# Patient Record
Sex: Female | Born: 1945 | Race: White | Hispanic: No | Marital: Married | Smoking: Current some day smoker
Health system: Southern US, Community
[De-identification: ages and names within clinical notes are randomized; demographics above are authoritative.]

## PROBLEM LIST (undated history)

## (undated) DIAGNOSIS — Z9221 Personal history of antineoplastic chemotherapy: Secondary | ICD-10-CM

## (undated) DIAGNOSIS — C50919 Malignant neoplasm of unspecified site of unspecified female breast: Secondary | ICD-10-CM

## (undated) DIAGNOSIS — C801 Malignant (primary) neoplasm, unspecified: Secondary | ICD-10-CM

## (undated) HISTORY — PX: HYSTEROTOMY: SHX1776

## (undated) HISTORY — PX: MASTECTOMY: SHX3

---

## 1999-08-17 ENCOUNTER — Encounter: Payer: Self-pay | Admitting: Endocrinology

## 1999-08-17 ENCOUNTER — Encounter: Admission: RE | Admit: 1999-08-17 | Discharge: 1999-08-17 | Payer: Self-pay | Admitting: Endocrinology

## 1999-08-18 ENCOUNTER — Encounter: Admission: RE | Admit: 1999-08-18 | Discharge: 1999-08-18 | Payer: Self-pay | Admitting: Endocrinology

## 1999-08-18 ENCOUNTER — Encounter: Payer: Self-pay | Admitting: Endocrinology

## 2000-08-22 ENCOUNTER — Encounter: Admission: RE | Admit: 2000-08-22 | Discharge: 2000-08-22 | Payer: Self-pay | Admitting: Endocrinology

## 2000-08-22 ENCOUNTER — Encounter: Payer: Self-pay | Admitting: Endocrinology

## 2001-08-27 ENCOUNTER — Encounter: Admission: RE | Admit: 2001-08-27 | Discharge: 2001-08-27 | Payer: Self-pay | Admitting: Endocrinology

## 2001-08-27 ENCOUNTER — Encounter: Payer: Self-pay | Admitting: Endocrinology

## 2001-12-15 ENCOUNTER — Emergency Department (HOSPITAL_COMMUNITY): Admission: EM | Admit: 2001-12-15 | Discharge: 2001-12-15 | Payer: Self-pay | Admitting: Emergency Medicine

## 2002-03-15 ENCOUNTER — Encounter (INDEPENDENT_AMBULATORY_CARE_PROVIDER_SITE_OTHER): Payer: Self-pay | Admitting: Specialist

## 2002-03-15 ENCOUNTER — Ambulatory Visit (HOSPITAL_COMMUNITY): Admission: RE | Admit: 2002-03-15 | Discharge: 2002-03-15 | Payer: Self-pay | Admitting: Gastroenterology

## 2002-08-30 ENCOUNTER — Encounter: Payer: Self-pay | Admitting: Endocrinology

## 2002-08-30 ENCOUNTER — Encounter: Admission: RE | Admit: 2002-08-30 | Discharge: 2002-08-30 | Payer: Self-pay | Admitting: Endocrinology

## 2003-09-01 ENCOUNTER — Encounter: Admission: RE | Admit: 2003-09-01 | Discharge: 2003-09-01 | Payer: Self-pay | Admitting: Endocrinology

## 2004-09-01 ENCOUNTER — Encounter: Admission: RE | Admit: 2004-09-01 | Discharge: 2004-09-01 | Payer: Self-pay | Admitting: Endocrinology

## 2005-09-05 ENCOUNTER — Encounter: Admission: RE | Admit: 2005-09-05 | Discharge: 2005-09-05 | Payer: Self-pay | Admitting: Endocrinology

## 2006-09-07 ENCOUNTER — Encounter: Admission: RE | Admit: 2006-09-07 | Discharge: 2006-09-07 | Payer: Self-pay | Admitting: Endocrinology

## 2007-09-10 ENCOUNTER — Encounter: Admission: RE | Admit: 2007-09-10 | Discharge: 2007-09-10 | Payer: Self-pay | Admitting: Endocrinology

## 2008-09-12 ENCOUNTER — Encounter: Admission: RE | Admit: 2008-09-12 | Discharge: 2008-09-12 | Payer: Self-pay | Admitting: Endocrinology

## 2009-04-23 ENCOUNTER — Emergency Department (HOSPITAL_COMMUNITY): Admission: EM | Admit: 2009-04-23 | Discharge: 2009-04-23 | Payer: Self-pay | Admitting: Family Medicine

## 2009-09-16 ENCOUNTER — Encounter: Admission: RE | Admit: 2009-09-16 | Discharge: 2009-09-16 | Payer: Self-pay | Admitting: Endocrinology

## 2009-12-09 ENCOUNTER — Encounter: Payer: Self-pay | Admitting: Emergency Medicine

## 2009-12-10 ENCOUNTER — Inpatient Hospital Stay (HOSPITAL_COMMUNITY): Admission: EM | Admit: 2009-12-10 | Discharge: 2009-12-10 | Payer: Self-pay | Admitting: Cardiology

## 2010-07-09 LAB — CBC
HCT: 43.8 % (ref 36.0–46.0)
Hemoglobin: 15 g/dL (ref 12.0–15.0)
MCH: 31.1 pg (ref 26.0–34.0)
MCHC: 34.3 g/dL (ref 30.0–36.0)
MCHC: 33.9 g/dL (ref 30.0–36.0)
MCV: 93.8 fL (ref 78.0–100.0)
Platelets: 254 10*3/uL (ref 150–400)
RBC: 4.67 MIL/uL (ref 3.87–5.11)
RBC: 4.37 MIL/uL (ref 3.87–5.11)
WBC: 10.1 10*3/uL (ref 4.0–10.5)
WBC: 8.9 10*3/uL (ref 4.0–10.5)

## 2010-07-09 LAB — COMPREHENSIVE METABOLIC PANEL
AST: 23 U/L (ref 0–37)
Alkaline Phosphatase: 54 U/L (ref 39–117)
BUN: 15 mg/dL (ref 6–23)
Chloride: 109 mEq/L (ref 96–112)
Potassium: 3.7 mEq/L (ref 3.5–5.1)
Sodium: 141 mEq/L (ref 135–145)
Total Protein: 6.7 g/dL (ref 6.0–8.3)

## 2010-07-09 LAB — PROTIME-INR: INR: 0.95 (ref 0.00–1.49)

## 2010-07-09 LAB — BASIC METABOLIC PANEL
BUN: 20 mg/dL (ref 6–23)
CO2: 25 mEq/L (ref 19–32)
Creatinine, Ser: 0.72 mg/dL (ref 0.4–1.2)
GFR calc Af Amer: >60 mL/min (ref >60)
GFR calc non Af Amer: >60 mL/min (ref >60)
Sodium: 142 mEq/L (ref 135–145)

## 2010-07-09 LAB — CARDIAC PANEL(CRET KIN+CKTOT+MB+TROPI)
CK, MB: 2 ng/mL (ref 0.3–4.0)
CK, MB: 2 ng/mL (ref 0.3–4.0)
Relative Index: INVALID (ref 0.0–2.5)
Relative Index: 1.9 (ref 0.0–2.5)
Relative Index: 2.5 (ref 0.0–2.5)
Total CK: 93 U/L (ref 7–177)
Total CK: 103 U/L (ref 7–177)
Total CK: 104 U/L (ref 7–177)
Troponin I: 0.01 ng/mL (ref 0.00–0.06)
Troponin I: 0.01 ng/mL (ref 0.00–0.06)

## 2010-07-09 LAB — POCT CARDIAC MARKERS
CKMB, poc: 1.3 ng/mL (ref 1.0–8.0)
CKMB, poc: <1 ng/mL — ABNORMAL LOW (ref 1.0–8.0)

## 2010-07-09 LAB — DIFFERENTIAL
Basophils Relative: 1 % (ref 0–1)
Monocytes Absolute: 0.5 10*3/uL (ref 0.1–1.0)
Neutro Abs: 5.5 10*3/uL (ref 1.7–7.7)

## 2010-07-09 LAB — HEMOGLOBIN A1C
Hgb A1c MFr Bld: 5.9 % — ABNORMAL HIGH (ref <5.7)
Mean Plasma Glucose: 123 mg/dL — ABNORMAL HIGH (ref <117)

## 2010-07-09 LAB — TSH: TSH: 2.213 u[IU]/mL (ref 0.350–4.500)

## 2010-07-09 LAB — HEPARIN LEVEL (UNFRACTIONATED): Heparin Unfractionated: 0.16 IU/mL — ABNORMAL LOW (ref 0.30–0.70)

## 2010-07-09 LAB — LIPID PANEL: Cholesterol: 157 mg/dL (ref 0–200)

## 2010-08-06 ENCOUNTER — Other Ambulatory Visit: Payer: Self-pay | Admitting: Internal Medicine

## 2010-08-06 DIAGNOSIS — Z9010 Acquired absence of unspecified breast and nipple: Secondary | ICD-10-CM

## 2010-10-05 ENCOUNTER — Ambulatory Visit
Admission: RE | Admit: 2010-10-05 | Discharge: 2010-10-05 | Disposition: A | Discharge status: Home / Self Care | Payer: Federal, State, Local not specified - PPO | Source: Ambulatory Visit | Attending: Internal Medicine | Admitting: Internal Medicine

## 2010-10-05 DIAGNOSIS — Z9010 Acquired absence of unspecified breast and nipple: Secondary | ICD-10-CM

## 2011-08-18 DIAGNOSIS — Z1331 Encounter for screening for depression: Secondary | ICD-10-CM | POA: Diagnosis not present

## 2011-08-18 DIAGNOSIS — F17200 Nicotine dependence, unspecified, uncomplicated: Secondary | ICD-10-CM | POA: Diagnosis not present

## 2011-08-18 DIAGNOSIS — M899 Disorder of bone, unspecified: Secondary | ICD-10-CM | POA: Diagnosis not present

## 2011-08-18 DIAGNOSIS — C50919 Malignant neoplasm of unspecified site of unspecified female breast: Secondary | ICD-10-CM | POA: Diagnosis not present

## 2011-08-18 DIAGNOSIS — I10 Essential (primary) hypertension: Secondary | ICD-10-CM | POA: Diagnosis not present

## 2011-08-18 DIAGNOSIS — M949 Disorder of cartilage, unspecified: Secondary | ICD-10-CM | POA: Diagnosis not present

## 2011-08-18 DIAGNOSIS — E782 Mixed hyperlipidemia: Secondary | ICD-10-CM | POA: Diagnosis not present

## 2011-08-18 DIAGNOSIS — R7309 Other abnormal glucose: Secondary | ICD-10-CM | POA: Diagnosis not present

## 2011-09-05 ENCOUNTER — Other Ambulatory Visit: Payer: Self-pay | Admitting: Internal Medicine

## 2011-09-05 DIAGNOSIS — M949 Disorder of cartilage, unspecified: Secondary | ICD-10-CM | POA: Diagnosis not present

## 2011-09-05 DIAGNOSIS — Z1231 Encounter for screening mammogram for malignant neoplasm of breast: Secondary | ICD-10-CM

## 2011-09-05 DIAGNOSIS — Z9012 Acquired absence of left breast and nipple: Secondary | ICD-10-CM

## 2011-09-05 DIAGNOSIS — M899 Disorder of bone, unspecified: Secondary | ICD-10-CM | POA: Diagnosis not present

## 2011-09-20 DIAGNOSIS — Z79899 Other long term (current) drug therapy: Secondary | ICD-10-CM | POA: Diagnosis not present

## 2011-10-11 ENCOUNTER — Ambulatory Visit
Admission: RE | Admit: 2011-10-11 | Discharge: 2011-10-11 | Disposition: A | Discharge status: Home / Self Care | Payer: Federal, State, Local not specified - PPO | Source: Ambulatory Visit | Attending: Internal Medicine | Admitting: Internal Medicine

## 2011-10-11 DIAGNOSIS — Z9012 Acquired absence of left breast and nipple: Secondary | ICD-10-CM

## 2011-10-11 DIAGNOSIS — Z1231 Encounter for screening mammogram for malignant neoplasm of breast: Secondary | ICD-10-CM

## 2012-02-10 DIAGNOSIS — F17200 Nicotine dependence, unspecified, uncomplicated: Secondary | ICD-10-CM | POA: Diagnosis not present

## 2012-02-10 DIAGNOSIS — Z23 Encounter for immunization: Secondary | ICD-10-CM | POA: Diagnosis not present

## 2012-02-10 DIAGNOSIS — I10 Essential (primary) hypertension: Secondary | ICD-10-CM | POA: Diagnosis not present

## 2012-02-10 DIAGNOSIS — C50919 Malignant neoplasm of unspecified site of unspecified female breast: Secondary | ICD-10-CM | POA: Diagnosis not present

## 2012-02-10 DIAGNOSIS — E782 Mixed hyperlipidemia: Secondary | ICD-10-CM | POA: Diagnosis not present

## 2012-02-10 DIAGNOSIS — M949 Disorder of cartilage, unspecified: Secondary | ICD-10-CM | POA: Diagnosis not present

## 2012-02-10 DIAGNOSIS — M899 Disorder of bone, unspecified: Secondary | ICD-10-CM | POA: Diagnosis not present

## 2012-03-23 DIAGNOSIS — M2550 Pain in unspecified joint: Secondary | ICD-10-CM | POA: Diagnosis not present

## 2012-03-23 DIAGNOSIS — R609 Edema, unspecified: Secondary | ICD-10-CM | POA: Diagnosis not present

## 2012-03-23 DIAGNOSIS — M79609 Pain in unspecified limb: Secondary | ICD-10-CM | POA: Diagnosis not present

## 2012-04-13 DIAGNOSIS — H40019 Open angle with borderline findings, low risk, unspecified eye: Secondary | ICD-10-CM | POA: Diagnosis not present

## 2012-08-15 DIAGNOSIS — C50919 Malignant neoplasm of unspecified site of unspecified female breast: Secondary | ICD-10-CM | POA: Diagnosis not present

## 2012-08-15 DIAGNOSIS — E782 Mixed hyperlipidemia: Secondary | ICD-10-CM | POA: Diagnosis not present

## 2012-08-15 DIAGNOSIS — R7309 Other abnormal glucose: Secondary | ICD-10-CM | POA: Diagnosis not present

## 2012-08-15 DIAGNOSIS — F17200 Nicotine dependence, unspecified, uncomplicated: Secondary | ICD-10-CM | POA: Diagnosis not present

## 2012-08-15 DIAGNOSIS — Z1331 Encounter for screening for depression: Secondary | ICD-10-CM | POA: Diagnosis not present

## 2012-08-15 DIAGNOSIS — Z0000 Encounter for general adult medical examination without abnormal findings: Secondary | ICD-10-CM | POA: Diagnosis not present

## 2012-08-15 DIAGNOSIS — M899 Disorder of bone, unspecified: Secondary | ICD-10-CM | POA: Diagnosis not present

## 2012-08-15 DIAGNOSIS — I10 Essential (primary) hypertension: Secondary | ICD-10-CM | POA: Diagnosis not present

## 2012-09-12 DIAGNOSIS — D72829 Elevated white blood cell count, unspecified: Secondary | ICD-10-CM | POA: Diagnosis not present

## 2012-09-20 ENCOUNTER — Other Ambulatory Visit: Payer: Self-pay

## 2012-09-20 DIAGNOSIS — Z9012 Acquired absence of left breast and nipple: Secondary | ICD-10-CM

## 2012-09-20 DIAGNOSIS — Z1231 Encounter for screening mammogram for malignant neoplasm of breast: Secondary | ICD-10-CM

## 2012-11-06 ENCOUNTER — Ambulatory Visit: Payer: Medicare Other

## 2012-11-07 ENCOUNTER — Ambulatory Visit: Payer: Medicare Other

## 2012-11-22 DIAGNOSIS — T148 Other injury of unspecified body region: Secondary | ICD-10-CM | POA: Diagnosis not present

## 2012-11-28 ENCOUNTER — Ambulatory Visit
Admission: RE | Admit: 2012-11-28 | Discharge: 2012-11-28 | Disposition: A | Discharge status: Home / Self Care | Payer: Medicare Other | Source: Ambulatory Visit

## 2012-11-28 DIAGNOSIS — Z1231 Encounter for screening mammogram for malignant neoplasm of breast: Secondary | ICD-10-CM

## 2012-11-28 DIAGNOSIS — Z9012 Acquired absence of left breast and nipple: Secondary | ICD-10-CM

## 2012-11-29 ENCOUNTER — Other Ambulatory Visit: Payer: Self-pay | Admitting: Internal Medicine

## 2012-11-29 DIAGNOSIS — R928 Other abnormal and inconclusive findings on diagnostic imaging of breast: Secondary | ICD-10-CM

## 2012-12-13 ENCOUNTER — Ambulatory Visit
Admission: RE | Admit: 2012-12-13 | Discharge: 2012-12-13 | Disposition: A | Discharge status: Home / Self Care | Payer: Medicare Other | Source: Ambulatory Visit | Attending: Internal Medicine | Admitting: Internal Medicine

## 2012-12-13 DIAGNOSIS — R928 Other abnormal and inconclusive findings on diagnostic imaging of breast: Secondary | ICD-10-CM | POA: Diagnosis not present

## 2012-12-13 DIAGNOSIS — Z853 Personal history of malignant neoplasm of breast: Secondary | ICD-10-CM | POA: Diagnosis not present

## 2013-01-28 ENCOUNTER — Ambulatory Visit
Admission: RE | Admit: 2013-01-28 | Discharge: 2013-01-28 | Disposition: A | Discharge status: Home / Self Care | Payer: Medicare Other | Source: Ambulatory Visit | Attending: Internal Medicine | Admitting: Internal Medicine

## 2013-01-28 ENCOUNTER — Other Ambulatory Visit: Payer: Self-pay | Admitting: Internal Medicine

## 2013-01-28 DIAGNOSIS — R519 Headache, unspecified: Secondary | ICD-10-CM

## 2013-01-28 DIAGNOSIS — R000 Tachycardia, unspecified: Secondary | ICD-10-CM | POA: Diagnosis not present

## 2013-01-28 DIAGNOSIS — J3489 Other specified disorders of nose and nasal sinuses: Secondary | ICD-10-CM | POA: Diagnosis not present

## 2013-01-28 DIAGNOSIS — R6889 Other general symptoms and signs: Secondary | ICD-10-CM | POA: Diagnosis not present

## 2013-01-28 DIAGNOSIS — R51 Headache: Secondary | ICD-10-CM | POA: Diagnosis not present

## 2013-02-13 DIAGNOSIS — E782 Mixed hyperlipidemia: Secondary | ICD-10-CM | POA: Diagnosis not present

## 2013-02-13 DIAGNOSIS — R7309 Other abnormal glucose: Secondary | ICD-10-CM | POA: Diagnosis not present

## 2013-02-13 DIAGNOSIS — R700 Elevated erythrocyte sedimentation rate: Secondary | ICD-10-CM | POA: Diagnosis not present

## 2013-02-13 DIAGNOSIS — F17200 Nicotine dependence, unspecified, uncomplicated: Secondary | ICD-10-CM | POA: Diagnosis not present

## 2013-02-13 DIAGNOSIS — Z23 Encounter for immunization: Secondary | ICD-10-CM | POA: Diagnosis not present

## 2013-02-13 DIAGNOSIS — I10 Essential (primary) hypertension: Secondary | ICD-10-CM | POA: Diagnosis not present

## 2013-02-25 DIAGNOSIS — T148 Other injury of unspecified body region: Secondary | ICD-10-CM | POA: Diagnosis not present

## 2013-02-25 DIAGNOSIS — R51 Headache: Secondary | ICD-10-CM | POA: Diagnosis not present

## 2013-02-25 DIAGNOSIS — R634 Abnormal weight loss: Secondary | ICD-10-CM | POA: Diagnosis not present

## 2013-02-25 DIAGNOSIS — M2550 Pain in unspecified joint: Secondary | ICD-10-CM | POA: Diagnosis not present

## 2013-02-25 DIAGNOSIS — R700 Elevated erythrocyte sedimentation rate: Secondary | ICD-10-CM | POA: Diagnosis not present

## 2013-03-18 DIAGNOSIS — R700 Elevated erythrocyte sedimentation rate: Secondary | ICD-10-CM | POA: Diagnosis not present

## 2013-03-18 DIAGNOSIS — M2550 Pain in unspecified joint: Secondary | ICD-10-CM | POA: Diagnosis not present

## 2013-03-18 DIAGNOSIS — R51 Headache: Secondary | ICD-10-CM | POA: Diagnosis not present

## 2013-08-14 DIAGNOSIS — E782 Mixed hyperlipidemia: Secondary | ICD-10-CM | POA: Diagnosis not present

## 2013-08-14 DIAGNOSIS — Z1331 Encounter for screening for depression: Secondary | ICD-10-CM | POA: Diagnosis not present

## 2013-08-14 DIAGNOSIS — Z0000 Encounter for general adult medical examination without abnormal findings: Secondary | ICD-10-CM | POA: Diagnosis not present

## 2013-08-14 DIAGNOSIS — Z23 Encounter for immunization: Secondary | ICD-10-CM | POA: Diagnosis not present

## 2013-08-14 DIAGNOSIS — F17200 Nicotine dependence, unspecified, uncomplicated: Secondary | ICD-10-CM | POA: Diagnosis not present

## 2013-08-14 DIAGNOSIS — R7309 Other abnormal glucose: Secondary | ICD-10-CM | POA: Diagnosis not present

## 2013-08-14 DIAGNOSIS — C50919 Malignant neoplasm of unspecified site of unspecified female breast: Secondary | ICD-10-CM | POA: Diagnosis not present

## 2013-08-14 DIAGNOSIS — I10 Essential (primary) hypertension: Secondary | ICD-10-CM | POA: Diagnosis not present

## 2013-12-09 ENCOUNTER — Other Ambulatory Visit: Payer: Self-pay

## 2013-12-09 DIAGNOSIS — Z1231 Encounter for screening mammogram for malignant neoplasm of breast: Secondary | ICD-10-CM

## 2013-12-09 DIAGNOSIS — Z9012 Acquired absence of left breast and nipple: Secondary | ICD-10-CM

## 2013-12-17 ENCOUNTER — Ambulatory Visit
Admission: RE | Admit: 2013-12-17 | Discharge: 2013-12-17 | Disposition: A | Discharge status: Home / Self Care | Payer: Medicare Other | Source: Ambulatory Visit

## 2013-12-17 DIAGNOSIS — Z1231 Encounter for screening mammogram for malignant neoplasm of breast: Secondary | ICD-10-CM | POA: Diagnosis not present

## 2013-12-17 DIAGNOSIS — Z9012 Acquired absence of left breast and nipple: Secondary | ICD-10-CM

## 2013-12-19 ENCOUNTER — Other Ambulatory Visit: Payer: Self-pay | Admitting: Internal Medicine

## 2013-12-19 DIAGNOSIS — R928 Other abnormal and inconclusive findings on diagnostic imaging of breast: Secondary | ICD-10-CM

## 2013-12-26 ENCOUNTER — Ambulatory Visit
Admission: RE | Admit: 2013-12-26 | Discharge: 2013-12-26 | Disposition: A | Discharge status: Home / Self Care | Payer: Medicare Other | Source: Ambulatory Visit | Attending: Internal Medicine | Admitting: Internal Medicine

## 2013-12-26 ENCOUNTER — Encounter (INDEPENDENT_AMBULATORY_CARE_PROVIDER_SITE_OTHER): Payer: Self-pay

## 2013-12-26 DIAGNOSIS — R928 Other abnormal and inconclusive findings on diagnostic imaging of breast: Secondary | ICD-10-CM

## 2013-12-26 DIAGNOSIS — R922 Inconclusive mammogram: Secondary | ICD-10-CM | POA: Diagnosis not present

## 2013-12-26 DIAGNOSIS — Z853 Personal history of malignant neoplasm of breast: Secondary | ICD-10-CM | POA: Diagnosis not present

## 2014-02-11 DIAGNOSIS — E780 Pure hypercholesterolemia: Secondary | ICD-10-CM | POA: Diagnosis not present

## 2014-02-11 DIAGNOSIS — Z23 Encounter for immunization: Secondary | ICD-10-CM | POA: Diagnosis not present

## 2014-02-11 DIAGNOSIS — M8580 Other specified disorders of bone density and structure, unspecified site: Secondary | ICD-10-CM | POA: Diagnosis not present

## 2014-02-11 DIAGNOSIS — C50912 Malignant neoplasm of unspecified site of left female breast: Secondary | ICD-10-CM | POA: Diagnosis not present

## 2014-02-11 DIAGNOSIS — R7309 Other abnormal glucose: Secondary | ICD-10-CM | POA: Diagnosis not present

## 2014-02-11 DIAGNOSIS — F17210 Nicotine dependence, cigarettes, uncomplicated: Secondary | ICD-10-CM | POA: Diagnosis not present

## 2014-02-11 DIAGNOSIS — I10 Essential (primary) hypertension: Secondary | ICD-10-CM | POA: Diagnosis not present

## 2014-04-07 DIAGNOSIS — H2513 Age-related nuclear cataract, bilateral: Secondary | ICD-10-CM | POA: Diagnosis not present

## 2014-04-07 DIAGNOSIS — H43813 Vitreous degeneration, bilateral: Secondary | ICD-10-CM | POA: Diagnosis not present

## 2014-04-07 DIAGNOSIS — H1859 Other hereditary corneal dystrophies: Secondary | ICD-10-CM | POA: Diagnosis not present

## 2014-04-07 DIAGNOSIS — H5213 Myopia, bilateral: Secondary | ICD-10-CM | POA: Diagnosis not present

## 2014-05-16 IMAGING — MG MM DIGITAL SCREENING UNILAT*R* W/ CAD
2 series · 2 of 2 positions shown · non-contrast
Comparison: None

CLINICAL DATA: Screening.

RIGHT DIGITAL SCREENING MAMMOGRAM WITH CAD

[R MLO]
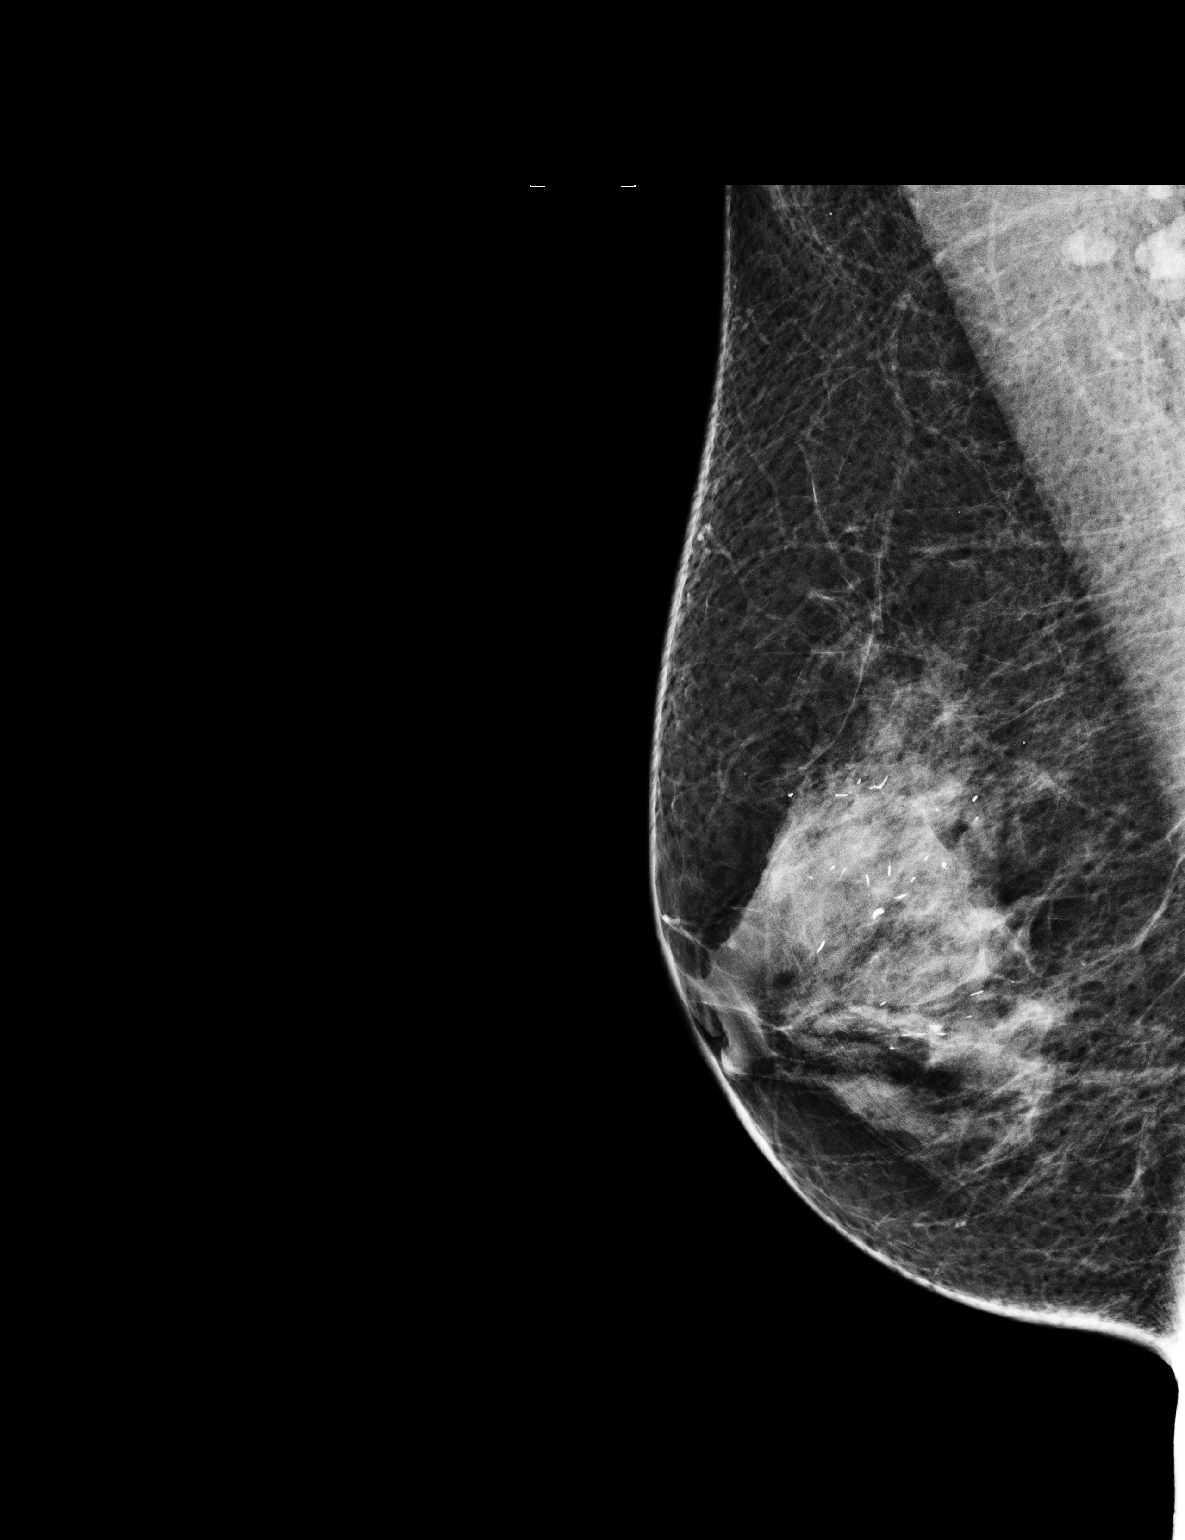

[R CC]
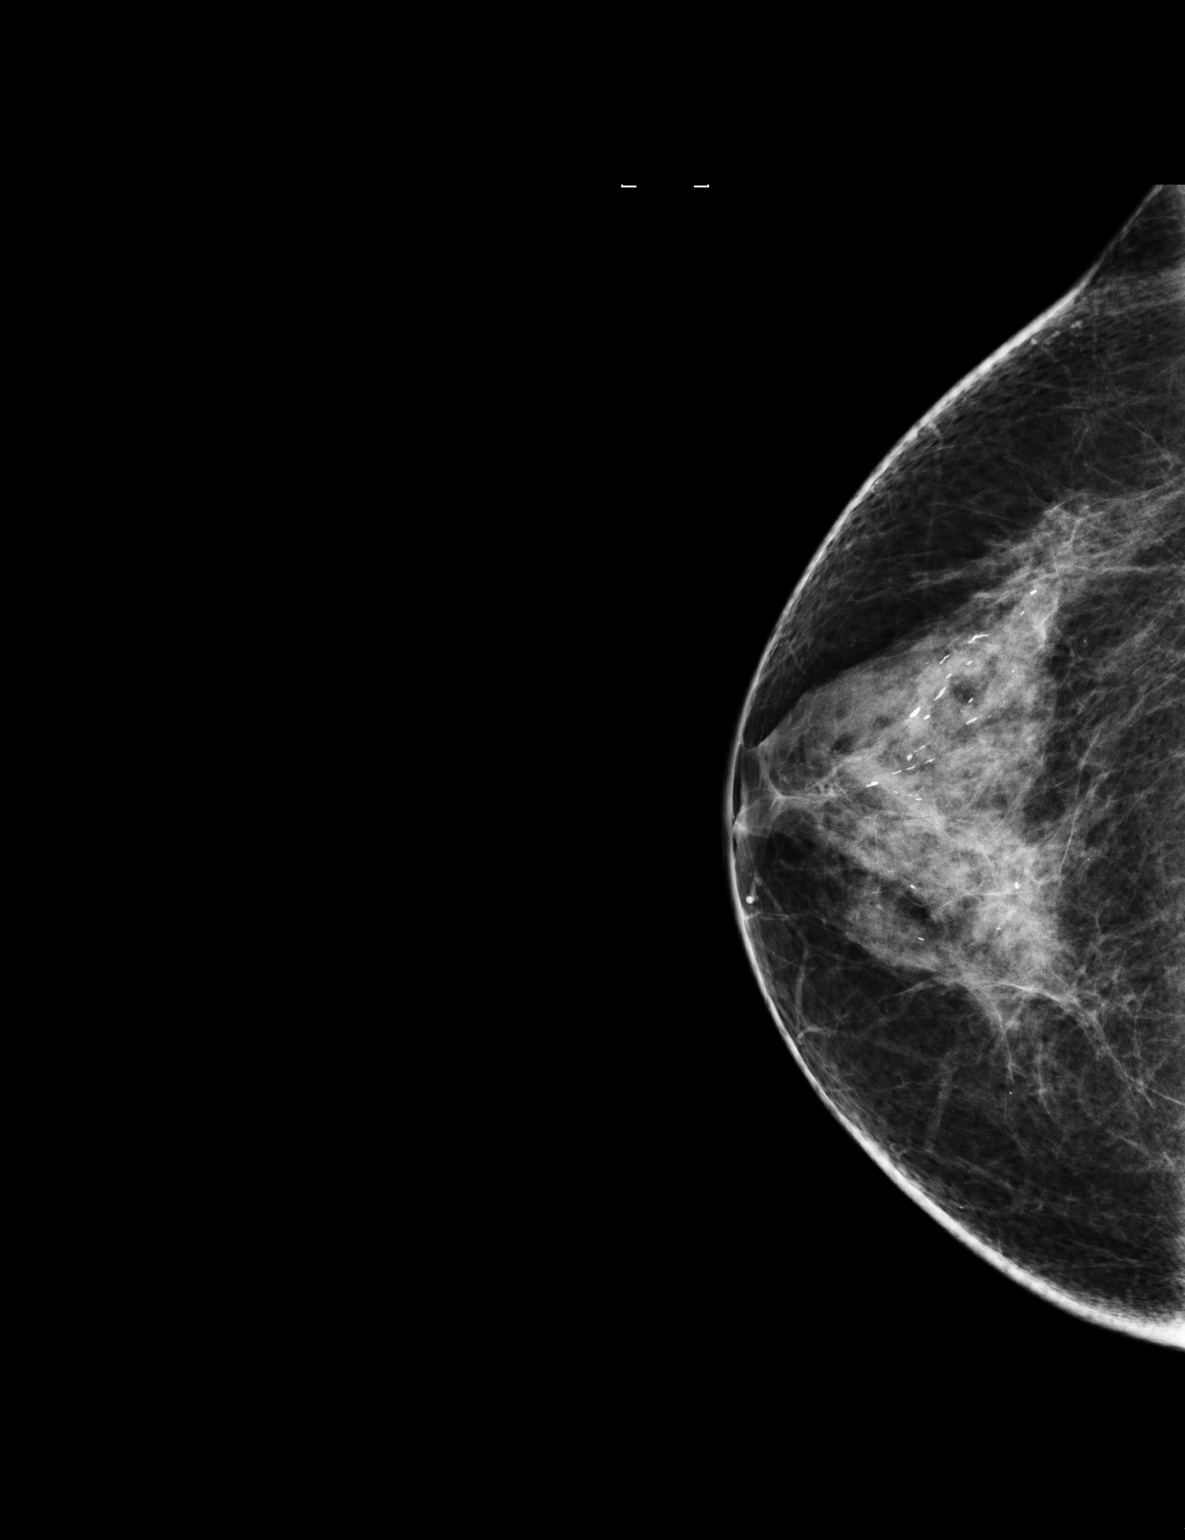

[2 of 2 positions shown; findings below may reference images not displayed]

FINDINGS: The patient has had a left mastectomy.  Views of the
right breast demonstrate heterogeneously dense tissue.  No
suspicious masses, architectural distortion, or calcifications are
present.

Images were processed with CAD.
IMPRESSION: No evidence of malignancy.  Screening mammography is recommended in
one year.

BI-RADS CATEGORY 1:  Negative

## 2014-08-19 DIAGNOSIS — E782 Mixed hyperlipidemia: Secondary | ICD-10-CM | POA: Diagnosis not present

## 2014-08-19 DIAGNOSIS — F17210 Nicotine dependence, cigarettes, uncomplicated: Secondary | ICD-10-CM | POA: Diagnosis not present

## 2014-08-19 DIAGNOSIS — M8588 Other specified disorders of bone density and structure, other site: Secondary | ICD-10-CM | POA: Diagnosis not present

## 2014-08-19 DIAGNOSIS — I10 Essential (primary) hypertension: Secondary | ICD-10-CM | POA: Diagnosis not present

## 2014-08-19 DIAGNOSIS — Z0000 Encounter for general adult medical examination without abnormal findings: Secondary | ICD-10-CM | POA: Diagnosis not present

## 2014-08-19 DIAGNOSIS — C50912 Malignant neoplasm of unspecified site of left female breast: Secondary | ICD-10-CM | POA: Diagnosis not present

## 2014-08-19 DIAGNOSIS — Z1389 Encounter for screening for other disorder: Secondary | ICD-10-CM | POA: Diagnosis not present

## 2014-08-19 DIAGNOSIS — R7309 Other abnormal glucose: Secondary | ICD-10-CM | POA: Diagnosis not present

## 2014-09-17 DIAGNOSIS — M810 Age-related osteoporosis without current pathological fracture: Secondary | ICD-10-CM | POA: Diagnosis not present

## 2015-02-06 ENCOUNTER — Other Ambulatory Visit: Payer: Self-pay

## 2015-02-06 DIAGNOSIS — Z1231 Encounter for screening mammogram for malignant neoplasm of breast: Secondary | ICD-10-CM

## 2015-02-13 ENCOUNTER — Ambulatory Visit: Payer: Federal, State, Local not specified - PPO

## 2015-02-19 DIAGNOSIS — R7309 Other abnormal glucose: Secondary | ICD-10-CM | POA: Diagnosis not present

## 2015-02-19 DIAGNOSIS — E782 Mixed hyperlipidemia: Secondary | ICD-10-CM | POA: Diagnosis not present

## 2015-02-19 DIAGNOSIS — C50919 Malignant neoplasm of unspecified site of unspecified female breast: Secondary | ICD-10-CM | POA: Diagnosis not present

## 2015-02-19 DIAGNOSIS — I10 Essential (primary) hypertension: Secondary | ICD-10-CM | POA: Diagnosis not present

## 2015-02-19 DIAGNOSIS — Z23 Encounter for immunization: Secondary | ICD-10-CM | POA: Diagnosis not present

## 2015-02-19 DIAGNOSIS — F17210 Nicotine dependence, cigarettes, uncomplicated: Secondary | ICD-10-CM | POA: Diagnosis not present

## 2015-02-25 ENCOUNTER — Ambulatory Visit
Admission: RE | Admit: 2015-02-25 | Discharge: 2015-02-25 | Disposition: A | Discharge status: Home / Self Care | Payer: Medicare Other | Source: Ambulatory Visit

## 2015-02-25 DIAGNOSIS — Z1231 Encounter for screening mammogram for malignant neoplasm of breast: Secondary | ICD-10-CM

## 2015-03-02 ENCOUNTER — Emergency Department (HOSPITAL_COMMUNITY)
Admission: EM | Admit: 2015-03-02 | Discharge: 2015-03-02 | Disposition: A | Discharge status: Home / Self Care | Payer: Medicare Other | Attending: Emergency Medicine | Admitting: Emergency Medicine

## 2015-03-02 ENCOUNTER — Emergency Department (HOSPITAL_COMMUNITY): Payer: Medicare Other

## 2015-03-02 ENCOUNTER — Encounter (HOSPITAL_COMMUNITY): Payer: Self-pay | Admitting: Nurse Practitioner

## 2015-03-02 DIAGNOSIS — Y9389 Activity, other specified: Secondary | ICD-10-CM | POA: Insufficient documentation

## 2015-03-02 DIAGNOSIS — Z859 Personal history of malignant neoplasm, unspecified: Secondary | ICD-10-CM | POA: Diagnosis not present

## 2015-03-02 DIAGNOSIS — F419 Anxiety disorder, unspecified: Secondary | ICD-10-CM | POA: Diagnosis not present

## 2015-03-02 DIAGNOSIS — M25511 Pain in right shoulder: Secondary | ICD-10-CM | POA: Diagnosis not present

## 2015-03-02 DIAGNOSIS — Y998 Other external cause status: Secondary | ICD-10-CM | POA: Diagnosis not present

## 2015-03-02 DIAGNOSIS — S42211A Unspecified displaced fracture of surgical neck of right humerus, initial encounter for closed fracture: Secondary | ICD-10-CM | POA: Insufficient documentation

## 2015-03-02 DIAGNOSIS — S42201A Unspecified fracture of upper end of right humerus, initial encounter for closed fracture: Secondary | ICD-10-CM

## 2015-03-02 DIAGNOSIS — S4991XA Unspecified injury of right shoulder and upper arm, initial encounter: Secondary | ICD-10-CM | POA: Diagnosis present

## 2015-03-02 DIAGNOSIS — W010XXA Fall on same level from slipping, tripping and stumbling without subsequent striking against object, initial encounter: Secondary | ICD-10-CM | POA: Diagnosis not present

## 2015-03-02 DIAGNOSIS — Y9289 Other specified places as the place of occurrence of the external cause: Secondary | ICD-10-CM | POA: Insufficient documentation

## 2015-03-02 DIAGNOSIS — Z720 Tobacco use: Secondary | ICD-10-CM | POA: Diagnosis not present

## 2015-03-02 HISTORY — DX: Malignant (primary) neoplasm, unspecified: C80.1

## 2015-03-02 MED ORDER — HYDROCODONE-ACETAMINOPHEN 5-325 MG PO TABS: 1.0000 | ORAL_TABLET | ORAL | Status: DC | PRN

## 2015-03-02 MED ORDER — ONDANSETRON 4 MG PO TBDP
4.0000 mg | ORAL_TABLET | Freq: Once | ORAL | Status: AC
  Administered 2015-03-02: 4 mg via ORAL
  Filled 2015-03-02: qty 1

## 2015-03-02 MED ORDER — ONDANSETRON 4 MG PO TBDP
ORAL_TABLET | ORAL | Status: DC
Start: 2015-03-02 — End: 2020-08-17

## 2015-03-02 MED ORDER — HYDROCODONE-ACETAMINOPHEN 5-325 MG PO TABS
1.0000 | ORAL_TABLET | Freq: Once | ORAL | Status: AC
  Administered 2015-03-02: 1 via ORAL
  Filled 2015-03-02: qty 1

## 2015-03-02 NOTE — ED Notes (Signed)
Art, MUS called ortho tech at [facility] to come complete orders.

## 2015-03-02 NOTE — Progress Notes (Signed)
Orthopedic Tech Progress Note Patient Details:  Denise Olson 1945/10/02 [id number] Applied fiberglass coaptation splint to RUE.  Pulses, sensation, motion intact before and after splinting.  Capillary refill less than 2 seconds before and after splinting.  Placed splinted RUE in arm sling. Ortho Devices Type of Ortho Device: Coapt, Arm sling Ortho Device/Splint Location: RUE Ortho Device/Splint Interventions: Application   Darrol Poke 03/02/2015, 4:11 AM

## 2015-03-02 NOTE — ED Notes (Signed)
Using clean technique, cleansed left knee abrasion with wound cleaner. Applied xerform over the wound. Secured with surgical tape.

## 2015-03-02 NOTE — ED Notes (Signed)
Pt states she had a fall, landed on her right shoulder which has pain and right arm. Rates the pain 5/10.

## 2015-03-02 NOTE — ED Provider Notes (Signed)
CSN: [id number]   Arrival date & time 03/02/15 0006  History  By signing my name below, I, Altamease Oiler, attest that this documentation has been prepared under the direction and in the presence of Julianne Rice, MD. Electronically Signed: Altamease Oiler, ED Scribe. 03/02/2015. 1:32 AM.  Chief Complaint  Patient presents with  . Shoulder Pain  . Arm Pain    HPI The history is provided by the patient. No language interpreter was used.   Denise Olson is a 69 y.o. female who presents to the Emergency Department complaining of a fall tonight while walking her dog. She tripped and fell landing on the right shoulder.  Associated symptoms include constant right shoulder pain, right upper arm pain, and left knee abrasion. Pt denies head injury, LOC, neck pain, right elbow pain. No numbness or weakness.  Past Medical History  Diagnosis Date  . Cancer [facility])     Past Surgical History  Procedure Laterality Date  . Hysterotomy      History reviewed. No pertinent family history.  Social History  Substance Use Topics  . Smoking status: Current Some Day Smoker -- 0.50 packs/day    Types: Cigarettes  . Smokeless tobacco: None  . Alcohol Use: No     Review of Systems  HENT: Negative for facial swelling.   Cardiovascular: Negative for chest pain.  Gastrointestinal: Negative for nausea, vomiting and abdominal pain.  Musculoskeletal: Positive for arthralgias. Negative for neck pain.  Skin: Positive for wound. Negative for rash.  Neurological: Negative for syncope, weakness, numbness and headaches.  All other systems reviewed and are negative.  Home Medications   Prior to Admission medications   Medication Sig Start Date End Date Taking? Authorizing Provider  HYDROcodone-acetaminophen (NORCO) 5-325 MG tablet Take 1 tablet by mouth every 4 (four) hours as needed for severe pain. 03/02/15   Julianne Rice, MD  ondansetron (ZOFRAN ODT) 4 MG disintegrating tablet 4mg  ODT q4 hours prn  nausea/vomit 03/02/15   Julianne Rice, MD    Allergies  Review of patient's allergies indicates no known allergies.  Triage Vitals: BP 128/74 mmHg  Pulse 98  Temp(Src) 98.3 F (36.8 C) (Oral)  Resp 20  Ht 5\' 2"  (1.575 m)  Wt 115 lb (52.164 kg)  BMI 21.03 kg/m2  SpO2 98%  Physical Exam  Constitutional: She is oriented to person, place, and time. She appears well-developed and well-nourished. No distress.  HENT:  Head: Normocephalic and atraumatic.  Mouth/Throat: Oropharynx is clear and moist.  Eyes: EOM are normal. Pupils are equal, round, and reactive to light.  Neck: Normal range of motion. Neck supple.  No posterior midline cervical tenderness to palpation.  Cardiovascular: Normal rate and regular rhythm.  Exam reveals no gallop and no friction rub.   No murmur heard. Pulmonary/Chest: Effort normal and breath sounds normal. No respiratory distress. She has no wheezes. She has no rales. She exhibits no tenderness.  Abdominal: Soft. Bowel sounds are normal. She exhibits no distension and no mass. There is no tenderness. There is no rebound and no guarding.  Musculoskeletal: She exhibits tenderness. She exhibits no edema.  Anterior right shoulder tenderness to palpation with decreased range of motion due to pain. Mild swelling compared to left. Distal pulses intact. Full range of motion of the right elbow and wrist.  Neurological: She is alert and oriented to person, place, and time.  5/5 right grip strength. Sensation fully intact.  Skin: Skin is warm and dry. No rash noted. No erythema.  Psychiatric:  Her behavior is normal.  Anxious  Nursing note and vitals reviewed.   ED Course  Procedures   DIAGNOSTIC STUDIES: Oxygen Saturation is 98% on RA, normal by my interpretation.    COORDINATION OF CARE: 1:20 AM Discussed treatment plan which includes right shoulder XR with pt at bedside and pt agreed to plan.  Labs Reviewed - No data to display  Imaging Review Dg Shoulder  Right  03/02/2015  CLINICAL DATA:  Right shoulder pain after fall from porch. Initial encounter. EXAM: RIGHT SHOULDER - 2 VIEW COMPARISON:  None. FINDINGS: Acute oblique fracture through the surgical neck of the humerus extending into the upper diaphysis medially. There is up to 3 mm cortical displacement medially. No definitive tuberosity or head continuation. Low humeral head, but located on scapular Y-view, presumably hemarthrosis. No evidence of scapular fracture. Degenerative glenohumeral marginal spurring. IMPRESSION: Surgical neck fracture and inferior humeral head subluxation as described above. Electronically Signed   By: Monte Fantasia M.D.   On: 03/02/2015 02:03    I personally reviewed and evaluated these images as a part of my medical decision-making.    MDM   Final diagnoses:  Proximal humerus fracture, right, closed, initial encounter    I personally performed the services described in this documentation, which was scribed in my presence. The recorded information has been reviewed and is accurate.   Patient placed in a long-arm splint and sling. We'll give the patient orthopedic follow-up. Return precautions given.  Julianne Rice, MD 03/02/15 [phone]

## 2015-03-02 NOTE — Discharge Instructions (Signed)
Humerus Fracture Treated With Immobilization °The humerus is the large bone in your upper arm. You have a broken (fractured) humerus. These fractures are easily diagnosed with X-rays. °TREATMENT  °Simple fractures which will heal without disability are treated with simple immobilization. Immobilization means you will wear a cast, splint, or sling. You have a fracture which will do well with immobilization. The fracture will heal well simply by being held in a good position until it is stable enough to begin range of motion exercises. Do not take part in activities which would further injure your arm.  °HOME CARE INSTRUCTIONS  °· Put ice on the injured area. °¨ Put ice in a plastic bag. °¨ Place a towel between your skin and the bag. °¨ Leave the ice on for 15-20 minutes, 03-04 times a day. °· If you have a cast: °¨ Do not scratch the skin under the cast using sharp or pointed objects. °¨ Check the skin around the cast every day. You may put lotion on any red or sore areas. °¨ Keep your cast dry and clean. °· If you have a splint: °¨ Wear the splint as directed. °¨ Keep your splint dry and clean. °¨ You may loosen the elastic around the splint if your fingers become numb, tingle, or turn cold or blue. °· If you have a sling: °¨ Wear the sling as directed. °· Do not put pressure on any part of your cast or splint until it is fully hardened. °· Your cast or splint can be protected during bathing with a plastic bag. Do not lower the cast or splint into water. °· Only take over-the-counter or prescription medicines for pain, discomfort, or fever as directed by your caregiver. °· Do range of motion exercises as instructed by your caregiver. °· Follow up as directed by your caregiver. This is very important in order to avoid permanent injury or disability and chronic pain. °SEEK IMMEDIATE MEDICAL CARE IF:  °· Your skin or nails in the injured arm turn blue or gray. °· Your arm feels cold or numb. °· You develop severe pain  in the injured arm. °· You are having problems with the medicines you were given. °MAKE SURE YOU:  °· Understand these instructions. °· Will watch your condition. °· Will get help right away if you are not doing well or get worse. °  °This information is not intended to replace advice given to you by your health care provider. Make sure you discuss any questions you have with your health care provider. °  °Document Released: 07/18/2000 Document Revised: 05/02/2014 Document Reviewed: 09/03/2014 °Elsevier Interactive Patient Education ©2016 [employer] ° °

## 2015-03-02 NOTE — ED Notes (Signed)
Patient transported to X-ray 

## 2015-03-02 NOTE — ED Notes (Signed)
Provider would like to give patient the Zofran and wait about 10-15 minutes before giving Norco to see make sure she does not get nausea from pain medication.

## 2015-03-03 DIAGNOSIS — S42201A Unspecified fracture of upper end of right humerus, initial encounter for closed fracture: Secondary | ICD-10-CM | POA: Diagnosis not present

## 2015-03-10 DIAGNOSIS — M25562 Pain in left knee: Secondary | ICD-10-CM | POA: Diagnosis not present

## 2015-03-10 DIAGNOSIS — M545 Low back pain: Secondary | ICD-10-CM | POA: Diagnosis not present

## 2015-03-10 DIAGNOSIS — S42201D Unspecified fracture of upper end of right humerus, subsequent encounter for fracture with routine healing: Secondary | ICD-10-CM | POA: Diagnosis not present

## 2015-03-24 DIAGNOSIS — S42201D Unspecified fracture of upper end of right humerus, subsequent encounter for fracture with routine healing: Secondary | ICD-10-CM | POA: Diagnosis not present

## 2015-03-24 DIAGNOSIS — M25562 Pain in left knee: Secondary | ICD-10-CM | POA: Diagnosis not present

## 2015-03-24 DIAGNOSIS — M545 Low back pain: Secondary | ICD-10-CM | POA: Diagnosis not present

## 2015-04-17 DIAGNOSIS — S42201D Unspecified fracture of upper end of right humerus, subsequent encounter for fracture with routine healing: Secondary | ICD-10-CM | POA: Diagnosis not present

## 2015-04-23 DIAGNOSIS — M25511 Pain in right shoulder: Secondary | ICD-10-CM | POA: Diagnosis not present

## 2015-04-23 DIAGNOSIS — M6281 Muscle weakness (generalized): Secondary | ICD-10-CM | POA: Diagnosis not present

## 2015-04-23 DIAGNOSIS — M62521 Muscle wasting and atrophy, not elsewhere classified, right upper arm: Secondary | ICD-10-CM | POA: Diagnosis not present

## 2015-04-23 DIAGNOSIS — M25611 Stiffness of right shoulder, not elsewhere classified: Secondary | ICD-10-CM | POA: Diagnosis not present

## 2015-04-24 DIAGNOSIS — M25611 Stiffness of right shoulder, not elsewhere classified: Secondary | ICD-10-CM | POA: Diagnosis not present

## 2015-04-24 DIAGNOSIS — M62521 Muscle wasting and atrophy, not elsewhere classified, right upper arm: Secondary | ICD-10-CM | POA: Diagnosis not present

## 2015-04-24 DIAGNOSIS — M6281 Muscle weakness (generalized): Secondary | ICD-10-CM | POA: Diagnosis not present

## 2015-04-24 DIAGNOSIS — M25511 Pain in right shoulder: Secondary | ICD-10-CM | POA: Diagnosis not present

## 2015-04-27 DIAGNOSIS — M62521 Muscle wasting and atrophy, not elsewhere classified, right upper arm: Secondary | ICD-10-CM | POA: Diagnosis not present

## 2015-04-27 DIAGNOSIS — M25511 Pain in right shoulder: Secondary | ICD-10-CM | POA: Diagnosis not present

## 2015-04-27 DIAGNOSIS — M6281 Muscle weakness (generalized): Secondary | ICD-10-CM | POA: Diagnosis not present

## 2015-04-27 DIAGNOSIS — M25611 Stiffness of right shoulder, not elsewhere classified: Secondary | ICD-10-CM | POA: Diagnosis not present

## 2015-04-29 DIAGNOSIS — M25511 Pain in right shoulder: Secondary | ICD-10-CM | POA: Diagnosis not present

## 2015-04-29 DIAGNOSIS — M25611 Stiffness of right shoulder, not elsewhere classified: Secondary | ICD-10-CM | POA: Diagnosis not present

## 2015-04-29 DIAGNOSIS — M62521 Muscle wasting and atrophy, not elsewhere classified, right upper arm: Secondary | ICD-10-CM | POA: Diagnosis not present

## 2015-04-29 DIAGNOSIS — M6281 Muscle weakness (generalized): Secondary | ICD-10-CM | POA: Diagnosis not present

## 2015-05-01 DIAGNOSIS — M6281 Muscle weakness (generalized): Secondary | ICD-10-CM | POA: Diagnosis not present

## 2015-05-01 DIAGNOSIS — M25511 Pain in right shoulder: Secondary | ICD-10-CM | POA: Diagnosis not present

## 2015-05-01 DIAGNOSIS — M25611 Stiffness of right shoulder, not elsewhere classified: Secondary | ICD-10-CM | POA: Diagnosis not present

## 2015-05-01 DIAGNOSIS — M62521 Muscle wasting and atrophy, not elsewhere classified, right upper arm: Secondary | ICD-10-CM | POA: Diagnosis not present

## 2015-05-06 DIAGNOSIS — M25611 Stiffness of right shoulder, not elsewhere classified: Secondary | ICD-10-CM | POA: Diagnosis not present

## 2015-05-06 DIAGNOSIS — M6281 Muscle weakness (generalized): Secondary | ICD-10-CM | POA: Diagnosis not present

## 2015-05-06 DIAGNOSIS — M25511 Pain in right shoulder: Secondary | ICD-10-CM | POA: Diagnosis not present

## 2015-05-06 DIAGNOSIS — M62521 Muscle wasting and atrophy, not elsewhere classified, right upper arm: Secondary | ICD-10-CM | POA: Diagnosis not present

## 2015-05-08 DIAGNOSIS — M6281 Muscle weakness (generalized): Secondary | ICD-10-CM | POA: Diagnosis not present

## 2015-05-08 DIAGNOSIS — M25611 Stiffness of right shoulder, not elsewhere classified: Secondary | ICD-10-CM | POA: Diagnosis not present

## 2015-05-08 DIAGNOSIS — M25511 Pain in right shoulder: Secondary | ICD-10-CM | POA: Diagnosis not present

## 2015-05-08 DIAGNOSIS — M62521 Muscle wasting and atrophy, not elsewhere classified, right upper arm: Secondary | ICD-10-CM | POA: Diagnosis not present

## 2015-05-11 DIAGNOSIS — M25611 Stiffness of right shoulder, not elsewhere classified: Secondary | ICD-10-CM | POA: Diagnosis not present

## 2015-05-11 DIAGNOSIS — M25511 Pain in right shoulder: Secondary | ICD-10-CM | POA: Diagnosis not present

## 2015-05-11 DIAGNOSIS — M62521 Muscle wasting and atrophy, not elsewhere classified, right upper arm: Secondary | ICD-10-CM | POA: Diagnosis not present

## 2015-05-11 DIAGNOSIS — M6281 Muscle weakness (generalized): Secondary | ICD-10-CM | POA: Diagnosis not present

## 2015-05-13 DIAGNOSIS — M6281 Muscle weakness (generalized): Secondary | ICD-10-CM | POA: Diagnosis not present

## 2015-05-13 DIAGNOSIS — M25511 Pain in right shoulder: Secondary | ICD-10-CM | POA: Diagnosis not present

## 2015-05-13 DIAGNOSIS — M25611 Stiffness of right shoulder, not elsewhere classified: Secondary | ICD-10-CM | POA: Diagnosis not present

## 2015-05-13 DIAGNOSIS — M62521 Muscle wasting and atrophy, not elsewhere classified, right upper arm: Secondary | ICD-10-CM | POA: Diagnosis not present

## 2015-05-15 DIAGNOSIS — M6281 Muscle weakness (generalized): Secondary | ICD-10-CM | POA: Diagnosis not present

## 2015-05-15 DIAGNOSIS — M62521 Muscle wasting and atrophy, not elsewhere classified, right upper arm: Secondary | ICD-10-CM | POA: Diagnosis not present

## 2015-05-15 DIAGNOSIS — M25511 Pain in right shoulder: Secondary | ICD-10-CM | POA: Diagnosis not present

## 2015-05-15 DIAGNOSIS — M25611 Stiffness of right shoulder, not elsewhere classified: Secondary | ICD-10-CM | POA: Diagnosis not present

## 2015-05-18 DIAGNOSIS — M25511 Pain in right shoulder: Secondary | ICD-10-CM | POA: Diagnosis not present

## 2015-05-18 DIAGNOSIS — M62521 Muscle wasting and atrophy, not elsewhere classified, right upper arm: Secondary | ICD-10-CM | POA: Diagnosis not present

## 2015-05-18 DIAGNOSIS — M6281 Muscle weakness (generalized): Secondary | ICD-10-CM | POA: Diagnosis not present

## 2015-05-18 DIAGNOSIS — M25611 Stiffness of right shoulder, not elsewhere classified: Secondary | ICD-10-CM | POA: Diagnosis not present

## 2015-05-20 DIAGNOSIS — M25611 Stiffness of right shoulder, not elsewhere classified: Secondary | ICD-10-CM | POA: Diagnosis not present

## 2015-05-20 DIAGNOSIS — M25511 Pain in right shoulder: Secondary | ICD-10-CM | POA: Diagnosis not present

## 2015-05-20 DIAGNOSIS — M6281 Muscle weakness (generalized): Secondary | ICD-10-CM | POA: Diagnosis not present

## 2015-05-20 DIAGNOSIS — M62521 Muscle wasting and atrophy, not elsewhere classified, right upper arm: Secondary | ICD-10-CM | POA: Diagnosis not present

## 2015-05-22 DIAGNOSIS — M6281 Muscle weakness (generalized): Secondary | ICD-10-CM | POA: Diagnosis not present

## 2015-05-22 DIAGNOSIS — M62521 Muscle wasting and atrophy, not elsewhere classified, right upper arm: Secondary | ICD-10-CM | POA: Diagnosis not present

## 2015-05-22 DIAGNOSIS — M25611 Stiffness of right shoulder, not elsewhere classified: Secondary | ICD-10-CM | POA: Diagnosis not present

## 2015-05-22 DIAGNOSIS — M25511 Pain in right shoulder: Secondary | ICD-10-CM | POA: Diagnosis not present

## 2015-05-25 DIAGNOSIS — M25611 Stiffness of right shoulder, not elsewhere classified: Secondary | ICD-10-CM | POA: Diagnosis not present

## 2015-05-25 DIAGNOSIS — M62521 Muscle wasting and atrophy, not elsewhere classified, right upper arm: Secondary | ICD-10-CM | POA: Diagnosis not present

## 2015-05-25 DIAGNOSIS — M25511 Pain in right shoulder: Secondary | ICD-10-CM | POA: Diagnosis not present

## 2015-05-25 DIAGNOSIS — M6281 Muscle weakness (generalized): Secondary | ICD-10-CM | POA: Diagnosis not present

## 2015-05-27 DIAGNOSIS — M6281 Muscle weakness (generalized): Secondary | ICD-10-CM | POA: Diagnosis not present

## 2015-05-27 DIAGNOSIS — M25611 Stiffness of right shoulder, not elsewhere classified: Secondary | ICD-10-CM | POA: Diagnosis not present

## 2015-05-27 DIAGNOSIS — M62521 Muscle wasting and atrophy, not elsewhere classified, right upper arm: Secondary | ICD-10-CM | POA: Diagnosis not present

## 2015-05-27 DIAGNOSIS — M25511 Pain in right shoulder: Secondary | ICD-10-CM | POA: Diagnosis not present

## 2015-05-28 DIAGNOSIS — S42201D Unspecified fracture of upper end of right humerus, subsequent encounter for fracture with routine healing: Secondary | ICD-10-CM | POA: Diagnosis not present

## 2015-05-29 DIAGNOSIS — M25511 Pain in right shoulder: Secondary | ICD-10-CM | POA: Diagnosis not present

## 2015-05-29 DIAGNOSIS — M62521 Muscle wasting and atrophy, not elsewhere classified, right upper arm: Secondary | ICD-10-CM | POA: Diagnosis not present

## 2015-05-29 DIAGNOSIS — M25611 Stiffness of right shoulder, not elsewhere classified: Secondary | ICD-10-CM | POA: Diagnosis not present

## 2015-05-29 DIAGNOSIS — M6281 Muscle weakness (generalized): Secondary | ICD-10-CM | POA: Diagnosis not present

## 2015-06-01 DIAGNOSIS — M25611 Stiffness of right shoulder, not elsewhere classified: Secondary | ICD-10-CM | POA: Diagnosis not present

## 2015-06-01 DIAGNOSIS — M25511 Pain in right shoulder: Secondary | ICD-10-CM | POA: Diagnosis not present

## 2015-06-01 DIAGNOSIS — M62521 Muscle wasting and atrophy, not elsewhere classified, right upper arm: Secondary | ICD-10-CM | POA: Diagnosis not present

## 2015-06-01 DIAGNOSIS — M6281 Muscle weakness (generalized): Secondary | ICD-10-CM | POA: Diagnosis not present

## 2015-06-03 DIAGNOSIS — M62521 Muscle wasting and atrophy, not elsewhere classified, right upper arm: Secondary | ICD-10-CM | POA: Diagnosis not present

## 2015-06-03 DIAGNOSIS — M25611 Stiffness of right shoulder, not elsewhere classified: Secondary | ICD-10-CM | POA: Diagnosis not present

## 2015-06-03 DIAGNOSIS — M25511 Pain in right shoulder: Secondary | ICD-10-CM | POA: Diagnosis not present

## 2015-06-03 DIAGNOSIS — M6281 Muscle weakness (generalized): Secondary | ICD-10-CM | POA: Diagnosis not present

## 2015-06-05 DIAGNOSIS — M62521 Muscle wasting and atrophy, not elsewhere classified, right upper arm: Secondary | ICD-10-CM | POA: Diagnosis not present

## 2015-06-05 DIAGNOSIS — M6281 Muscle weakness (generalized): Secondary | ICD-10-CM | POA: Diagnosis not present

## 2015-06-05 DIAGNOSIS — M25511 Pain in right shoulder: Secondary | ICD-10-CM | POA: Diagnosis not present

## 2015-06-05 DIAGNOSIS — M25611 Stiffness of right shoulder, not elsewhere classified: Secondary | ICD-10-CM | POA: Diagnosis not present

## 2015-06-08 DIAGNOSIS — M62521 Muscle wasting and atrophy, not elsewhere classified, right upper arm: Secondary | ICD-10-CM | POA: Diagnosis not present

## 2015-06-08 DIAGNOSIS — M6281 Muscle weakness (generalized): Secondary | ICD-10-CM | POA: Diagnosis not present

## 2015-06-08 DIAGNOSIS — M25611 Stiffness of right shoulder, not elsewhere classified: Secondary | ICD-10-CM | POA: Diagnosis not present

## 2015-06-08 DIAGNOSIS — M25511 Pain in right shoulder: Secondary | ICD-10-CM | POA: Diagnosis not present

## 2015-06-10 DIAGNOSIS — M6281 Muscle weakness (generalized): Secondary | ICD-10-CM | POA: Diagnosis not present

## 2015-06-10 DIAGNOSIS — M25611 Stiffness of right shoulder, not elsewhere classified: Secondary | ICD-10-CM | POA: Diagnosis not present

## 2015-06-10 DIAGNOSIS — M62521 Muscle wasting and atrophy, not elsewhere classified, right upper arm: Secondary | ICD-10-CM | POA: Diagnosis not present

## 2015-06-10 DIAGNOSIS — M25511 Pain in right shoulder: Secondary | ICD-10-CM | POA: Diagnosis not present

## 2015-06-12 DIAGNOSIS — M6281 Muscle weakness (generalized): Secondary | ICD-10-CM | POA: Diagnosis not present

## 2015-06-12 DIAGNOSIS — M25511 Pain in right shoulder: Secondary | ICD-10-CM | POA: Diagnosis not present

## 2015-06-12 DIAGNOSIS — M62521 Muscle wasting and atrophy, not elsewhere classified, right upper arm: Secondary | ICD-10-CM | POA: Diagnosis not present

## 2015-06-12 DIAGNOSIS — M25611 Stiffness of right shoulder, not elsewhere classified: Secondary | ICD-10-CM | POA: Diagnosis not present

## 2015-06-15 DIAGNOSIS — M25511 Pain in right shoulder: Secondary | ICD-10-CM | POA: Diagnosis not present

## 2015-06-15 DIAGNOSIS — M6281 Muscle weakness (generalized): Secondary | ICD-10-CM | POA: Diagnosis not present

## 2015-06-15 DIAGNOSIS — M25611 Stiffness of right shoulder, not elsewhere classified: Secondary | ICD-10-CM | POA: Diagnosis not present

## 2015-06-15 DIAGNOSIS — M62521 Muscle wasting and atrophy, not elsewhere classified, right upper arm: Secondary | ICD-10-CM | POA: Diagnosis not present

## 2015-06-22 DIAGNOSIS — M25511 Pain in right shoulder: Secondary | ICD-10-CM | POA: Diagnosis not present

## 2015-06-22 DIAGNOSIS — M62521 Muscle wasting and atrophy, not elsewhere classified, right upper arm: Secondary | ICD-10-CM | POA: Diagnosis not present

## 2015-06-22 DIAGNOSIS — M6281 Muscle weakness (generalized): Secondary | ICD-10-CM | POA: Diagnosis not present

## 2015-06-22 DIAGNOSIS — M25611 Stiffness of right shoulder, not elsewhere classified: Secondary | ICD-10-CM | POA: Diagnosis not present

## 2015-06-26 DIAGNOSIS — M62521 Muscle wasting and atrophy, not elsewhere classified, right upper arm: Secondary | ICD-10-CM | POA: Diagnosis not present

## 2015-06-26 DIAGNOSIS — M25511 Pain in right shoulder: Secondary | ICD-10-CM | POA: Diagnosis not present

## 2015-06-26 DIAGNOSIS — M6281 Muscle weakness (generalized): Secondary | ICD-10-CM | POA: Diagnosis not present

## 2015-06-26 DIAGNOSIS — M25611 Stiffness of right shoulder, not elsewhere classified: Secondary | ICD-10-CM | POA: Diagnosis not present

## 2015-06-29 DIAGNOSIS — M6281 Muscle weakness (generalized): Secondary | ICD-10-CM | POA: Diagnosis not present

## 2015-06-29 DIAGNOSIS — M25511 Pain in right shoulder: Secondary | ICD-10-CM | POA: Diagnosis not present

## 2015-06-29 DIAGNOSIS — M25611 Stiffness of right shoulder, not elsewhere classified: Secondary | ICD-10-CM | POA: Diagnosis not present

## 2015-06-29 DIAGNOSIS — M62521 Muscle wasting and atrophy, not elsewhere classified, right upper arm: Secondary | ICD-10-CM | POA: Diagnosis not present

## 2015-07-03 DIAGNOSIS — M25511 Pain in right shoulder: Secondary | ICD-10-CM | POA: Diagnosis not present

## 2015-07-03 DIAGNOSIS — M25611 Stiffness of right shoulder, not elsewhere classified: Secondary | ICD-10-CM | POA: Diagnosis not present

## 2015-07-03 DIAGNOSIS — M6281 Muscle weakness (generalized): Secondary | ICD-10-CM | POA: Diagnosis not present

## 2015-07-03 DIAGNOSIS — M62521 Muscle wasting and atrophy, not elsewhere classified, right upper arm: Secondary | ICD-10-CM | POA: Diagnosis not present

## 2015-07-06 DIAGNOSIS — M25511 Pain in right shoulder: Secondary | ICD-10-CM | POA: Diagnosis not present

## 2015-07-06 DIAGNOSIS — M62521 Muscle wasting and atrophy, not elsewhere classified, right upper arm: Secondary | ICD-10-CM | POA: Diagnosis not present

## 2015-07-06 DIAGNOSIS — M6281 Muscle weakness (generalized): Secondary | ICD-10-CM | POA: Diagnosis not present

## 2015-07-06 DIAGNOSIS — M25611 Stiffness of right shoulder, not elsewhere classified: Secondary | ICD-10-CM | POA: Diagnosis not present

## 2015-07-10 DIAGNOSIS — M62521 Muscle wasting and atrophy, not elsewhere classified, right upper arm: Secondary | ICD-10-CM | POA: Diagnosis not present

## 2015-07-10 DIAGNOSIS — M25511 Pain in right shoulder: Secondary | ICD-10-CM | POA: Diagnosis not present

## 2015-07-10 DIAGNOSIS — M25611 Stiffness of right shoulder, not elsewhere classified: Secondary | ICD-10-CM | POA: Diagnosis not present

## 2015-07-10 DIAGNOSIS — M6281 Muscle weakness (generalized): Secondary | ICD-10-CM | POA: Diagnosis not present

## 2015-08-21 DIAGNOSIS — Z0000 Encounter for general adult medical examination without abnormal findings: Secondary | ICD-10-CM | POA: Diagnosis not present

## 2015-08-21 DIAGNOSIS — R7303 Prediabetes: Secondary | ICD-10-CM | POA: Diagnosis not present

## 2015-08-21 DIAGNOSIS — C50912 Malignant neoplasm of unspecified site of left female breast: Secondary | ICD-10-CM | POA: Diagnosis not present

## 2015-08-21 DIAGNOSIS — I10 Essential (primary) hypertension: Secondary | ICD-10-CM | POA: Diagnosis not present

## 2015-08-21 DIAGNOSIS — E782 Mixed hyperlipidemia: Secondary | ICD-10-CM | POA: Diagnosis not present

## 2015-08-21 DIAGNOSIS — M810 Age-related osteoporosis without current pathological fracture: Secondary | ICD-10-CM | POA: Diagnosis not present

## 2015-08-21 DIAGNOSIS — R739 Hyperglycemia, unspecified: Secondary | ICD-10-CM | POA: Diagnosis not present

## 2015-08-21 DIAGNOSIS — Z1389 Encounter for screening for other disorder: Secondary | ICD-10-CM | POA: Diagnosis not present

## 2016-02-29 ENCOUNTER — Other Ambulatory Visit: Payer: Self-pay | Admitting: Internal Medicine

## 2016-02-29 DIAGNOSIS — Z1231 Encounter for screening mammogram for malignant neoplasm of breast: Secondary | ICD-10-CM

## 2016-03-10 DIAGNOSIS — R739 Hyperglycemia, unspecified: Secondary | ICD-10-CM | POA: Diagnosis not present

## 2016-03-10 DIAGNOSIS — C50919 Malignant neoplasm of unspecified site of unspecified female breast: Secondary | ICD-10-CM | POA: Diagnosis not present

## 2016-03-10 DIAGNOSIS — E7800 Pure hypercholesterolemia, unspecified: Secondary | ICD-10-CM | POA: Diagnosis not present

## 2016-03-10 DIAGNOSIS — I10 Essential (primary) hypertension: Secondary | ICD-10-CM | POA: Diagnosis not present

## 2016-03-10 DIAGNOSIS — M810 Age-related osteoporosis without current pathological fracture: Secondary | ICD-10-CM | POA: Diagnosis not present

## 2016-03-10 DIAGNOSIS — Z23 Encounter for immunization: Secondary | ICD-10-CM | POA: Diagnosis not present

## 2016-03-10 DIAGNOSIS — Z1159 Encounter for screening for other viral diseases: Secondary | ICD-10-CM | POA: Diagnosis not present

## 2016-04-01 ENCOUNTER — Ambulatory Visit
Admission: RE | Admit: 2016-04-01 | Discharge: 2016-04-01 | Disposition: A | Discharge status: Home / Self Care | Payer: Medicare Other | Source: Ambulatory Visit | Attending: Internal Medicine | Admitting: Internal Medicine

## 2016-04-01 DIAGNOSIS — Z1231 Encounter for screening mammogram for malignant neoplasm of breast: Secondary | ICD-10-CM

## 2016-08-22 DIAGNOSIS — C50912 Malignant neoplasm of unspecified site of left female breast: Secondary | ICD-10-CM | POA: Diagnosis not present

## 2016-08-22 DIAGNOSIS — Z0000 Encounter for general adult medical examination without abnormal findings: Secondary | ICD-10-CM | POA: Diagnosis not present

## 2016-08-22 DIAGNOSIS — I10 Essential (primary) hypertension: Secondary | ICD-10-CM | POA: Diagnosis not present

## 2016-08-22 DIAGNOSIS — Z1389 Encounter for screening for other disorder: Secondary | ICD-10-CM | POA: Diagnosis not present

## 2016-08-22 DIAGNOSIS — M810 Age-related osteoporosis without current pathological fracture: Secondary | ICD-10-CM | POA: Diagnosis not present

## 2016-08-22 DIAGNOSIS — E7800 Pure hypercholesterolemia, unspecified: Secondary | ICD-10-CM | POA: Diagnosis not present

## 2016-08-22 DIAGNOSIS — R7303 Prediabetes: Secondary | ICD-10-CM | POA: Diagnosis not present

## 2016-08-29 DIAGNOSIS — E782 Mixed hyperlipidemia: Secondary | ICD-10-CM | POA: Diagnosis not present

## 2016-08-29 DIAGNOSIS — I10 Essential (primary) hypertension: Secondary | ICD-10-CM | POA: Diagnosis not present

## 2016-08-29 DIAGNOSIS — M810 Age-related osteoporosis without current pathological fracture: Secondary | ICD-10-CM | POA: Diagnosis not present

## 2016-08-29 DIAGNOSIS — R7303 Prediabetes: Secondary | ICD-10-CM | POA: Diagnosis not present

## 2016-09-27 DIAGNOSIS — M810 Age-related osteoporosis without current pathological fracture: Secondary | ICD-10-CM | POA: Diagnosis not present

## 2017-01-30 DIAGNOSIS — R739 Hyperglycemia, unspecified: Secondary | ICD-10-CM | POA: Diagnosis not present

## 2017-01-30 DIAGNOSIS — M810 Age-related osteoporosis without current pathological fracture: Secondary | ICD-10-CM | POA: Diagnosis not present

## 2017-01-30 DIAGNOSIS — F17210 Nicotine dependence, cigarettes, uncomplicated: Secondary | ICD-10-CM | POA: Diagnosis not present

## 2017-01-30 DIAGNOSIS — I10 Essential (primary) hypertension: Secondary | ICD-10-CM | POA: Diagnosis not present

## 2017-01-30 DIAGNOSIS — C50912 Malignant neoplasm of unspecified site of left female breast: Secondary | ICD-10-CM | POA: Diagnosis not present

## 2017-01-30 DIAGNOSIS — E7800 Pure hypercholesterolemia, unspecified: Secondary | ICD-10-CM | POA: Diagnosis not present

## 2017-03-20 ENCOUNTER — Other Ambulatory Visit: Payer: Self-pay | Admitting: Internal Medicine

## 2017-03-20 DIAGNOSIS — Z1231 Encounter for screening mammogram for malignant neoplasm of breast: Secondary | ICD-10-CM

## 2017-04-13 ENCOUNTER — Ambulatory Visit
Admission: RE | Admit: 2017-04-13 | Discharge: 2017-04-13 | Disposition: A | Discharge status: Home / Self Care | Payer: Medicare Other | Source: Ambulatory Visit | Attending: Internal Medicine | Admitting: Internal Medicine

## 2017-04-13 DIAGNOSIS — Z1231 Encounter for screening mammogram for malignant neoplasm of breast: Secondary | ICD-10-CM | POA: Diagnosis not present

## 2017-04-13 HISTORY — DX: Personal history of antineoplastic chemotherapy: Z92.21

## 2017-08-23 DIAGNOSIS — E782 Mixed hyperlipidemia: Secondary | ICD-10-CM | POA: Diagnosis not present

## 2017-08-23 DIAGNOSIS — Z1211 Encounter for screening for malignant neoplasm of colon: Secondary | ICD-10-CM | POA: Diagnosis not present

## 2017-08-23 DIAGNOSIS — I10 Essential (primary) hypertension: Secondary | ICD-10-CM | POA: Diagnosis not present

## 2017-08-23 DIAGNOSIS — Z1389 Encounter for screening for other disorder: Secondary | ICD-10-CM | POA: Diagnosis not present

## 2017-08-23 DIAGNOSIS — R7309 Other abnormal glucose: Secondary | ICD-10-CM | POA: Diagnosis not present

## 2017-08-23 DIAGNOSIS — F17210 Nicotine dependence, cigarettes, uncomplicated: Secondary | ICD-10-CM | POA: Diagnosis not present

## 2017-08-23 DIAGNOSIS — M810 Age-related osteoporosis without current pathological fracture: Secondary | ICD-10-CM | POA: Diagnosis not present

## 2017-08-23 DIAGNOSIS — Z0000 Encounter for general adult medical examination without abnormal findings: Secondary | ICD-10-CM | POA: Diagnosis not present

## 2017-08-23 DIAGNOSIS — C50912 Malignant neoplasm of unspecified site of left female breast: Secondary | ICD-10-CM | POA: Diagnosis not present

## 2017-09-12 DIAGNOSIS — F17210 Nicotine dependence, cigarettes, uncomplicated: Secondary | ICD-10-CM | POA: Diagnosis not present

## 2018-02-27 DIAGNOSIS — Z1211 Encounter for screening for malignant neoplasm of colon: Secondary | ICD-10-CM | POA: Diagnosis not present

## 2018-02-27 DIAGNOSIS — C50912 Malignant neoplasm of unspecified site of left female breast: Secondary | ICD-10-CM | POA: Diagnosis not present

## 2018-02-27 DIAGNOSIS — I10 Essential (primary) hypertension: Secondary | ICD-10-CM | POA: Diagnosis not present

## 2018-02-27 DIAGNOSIS — E782 Mixed hyperlipidemia: Secondary | ICD-10-CM | POA: Diagnosis not present

## 2018-02-27 DIAGNOSIS — M810 Age-related osteoporosis without current pathological fracture: Secondary | ICD-10-CM | POA: Diagnosis not present

## 2018-02-27 DIAGNOSIS — R7303 Prediabetes: Secondary | ICD-10-CM | POA: Diagnosis not present

## 2018-05-17 ENCOUNTER — Other Ambulatory Visit: Payer: Self-pay | Admitting: Internal Medicine

## 2018-05-17 DIAGNOSIS — Z1231 Encounter for screening mammogram for malignant neoplasm of breast: Secondary | ICD-10-CM

## 2018-05-18 ENCOUNTER — Ambulatory Visit
Admission: RE | Admit: 2018-05-18 | Discharge: 2018-05-18 | Disposition: A | Discharge status: Home / Self Care | Payer: Medicare Other | Source: Ambulatory Visit | Attending: Internal Medicine | Admitting: Internal Medicine

## 2018-05-18 DIAGNOSIS — Z1231 Encounter for screening mammogram for malignant neoplasm of breast: Secondary | ICD-10-CM | POA: Diagnosis not present

## 2019-08-12 DIAGNOSIS — I10 Essential (primary) hypertension: Secondary | ICD-10-CM | POA: Diagnosis not present

## 2019-08-12 DIAGNOSIS — R7309 Other abnormal glucose: Secondary | ICD-10-CM | POA: Diagnosis not present

## 2019-08-12 DIAGNOSIS — Z0000 Encounter for general adult medical examination without abnormal findings: Secondary | ICD-10-CM | POA: Diagnosis not present

## 2019-08-12 DIAGNOSIS — Z1211 Encounter for screening for malignant neoplasm of colon: Secondary | ICD-10-CM | POA: Diagnosis not present

## 2019-08-12 DIAGNOSIS — F17200 Nicotine dependence, unspecified, uncomplicated: Secondary | ICD-10-CM | POA: Diagnosis not present

## 2019-08-12 DIAGNOSIS — Z1389 Encounter for screening for other disorder: Secondary | ICD-10-CM | POA: Diagnosis not present

## 2019-08-12 DIAGNOSIS — F17210 Nicotine dependence, cigarettes, uncomplicated: Secondary | ICD-10-CM | POA: Diagnosis not present

## 2019-08-12 DIAGNOSIS — E782 Mixed hyperlipidemia: Secondary | ICD-10-CM | POA: Diagnosis not present

## 2019-08-12 DIAGNOSIS — M810 Age-related osteoporosis without current pathological fracture: Secondary | ICD-10-CM | POA: Diagnosis not present

## 2019-08-12 DIAGNOSIS — Z853 Personal history of malignant neoplasm of breast: Secondary | ICD-10-CM | POA: Diagnosis not present

## 2019-08-13 ENCOUNTER — Other Ambulatory Visit: Payer: Self-pay | Admitting: Internal Medicine

## 2019-08-13 DIAGNOSIS — F17210 Nicotine dependence, cigarettes, uncomplicated: Secondary | ICD-10-CM

## 2019-08-16 ENCOUNTER — Other Ambulatory Visit: Payer: Self-pay | Admitting: Internal Medicine

## 2019-08-16 DIAGNOSIS — M810 Age-related osteoporosis without current pathological fracture: Secondary | ICD-10-CM

## 2019-08-16 DIAGNOSIS — Z1231 Encounter for screening mammogram for malignant neoplasm of breast: Secondary | ICD-10-CM

## 2019-08-28 ENCOUNTER — Other Ambulatory Visit: Payer: Self-pay

## 2019-08-28 ENCOUNTER — Ambulatory Visit
Admission: RE | Admit: 2019-08-28 | Discharge: 2019-08-28 | Disposition: A | Discharge status: Home / Self Care | Payer: Medicare Other | Source: Ambulatory Visit | Attending: Internal Medicine | Admitting: Internal Medicine

## 2019-08-28 DIAGNOSIS — F17210 Nicotine dependence, cigarettes, uncomplicated: Secondary | ICD-10-CM | POA: Diagnosis not present

## 2019-10-31 ENCOUNTER — Other Ambulatory Visit: Payer: Self-pay

## 2019-10-31 ENCOUNTER — Other Ambulatory Visit: Payer: Self-pay | Admitting: Internal Medicine

## 2019-10-31 ENCOUNTER — Ambulatory Visit
Admission: RE | Admit: 2019-10-31 | Discharge: 2019-10-31 | Disposition: A | Discharge status: Home / Self Care | Payer: Medicare Other | Source: Ambulatory Visit | Attending: Internal Medicine | Admitting: Internal Medicine

## 2019-10-31 DIAGNOSIS — Z1231 Encounter for screening mammogram for malignant neoplasm of breast: Secondary | ICD-10-CM

## 2019-10-31 DIAGNOSIS — M810 Age-related osteoporosis without current pathological fracture: Secondary | ICD-10-CM

## 2019-10-31 HISTORY — DX: Malignant neoplasm of unspecified site of unspecified female breast: C50.919

## 2020-03-09 DIAGNOSIS — Z23 Encounter for immunization: Secondary | ICD-10-CM | POA: Diagnosis not present

## 2020-08-12 DIAGNOSIS — Z23 Encounter for immunization: Secondary | ICD-10-CM | POA: Diagnosis not present

## 2020-08-12 DIAGNOSIS — I700 Atherosclerosis of aorta: Secondary | ICD-10-CM | POA: Diagnosis not present

## 2020-08-12 DIAGNOSIS — Z1389 Encounter for screening for other disorder: Secondary | ICD-10-CM | POA: Diagnosis not present

## 2020-08-12 DIAGNOSIS — B351 Tinea unguium: Secondary | ICD-10-CM | POA: Diagnosis not present

## 2020-08-12 DIAGNOSIS — M810 Age-related osteoporosis without current pathological fracture: Secondary | ICD-10-CM | POA: Diagnosis not present

## 2020-08-12 DIAGNOSIS — E7800 Pure hypercholesterolemia, unspecified: Secondary | ICD-10-CM | POA: Diagnosis not present

## 2020-08-12 DIAGNOSIS — R413 Other amnesia: Secondary | ICD-10-CM | POA: Diagnosis not present

## 2020-08-12 DIAGNOSIS — Z853 Personal history of malignant neoplasm of breast: Secondary | ICD-10-CM | POA: Diagnosis not present

## 2020-08-12 DIAGNOSIS — E46 Unspecified protein-calorie malnutrition: Secondary | ICD-10-CM | POA: Diagnosis not present

## 2020-08-12 DIAGNOSIS — F17210 Nicotine dependence, cigarettes, uncomplicated: Secondary | ICD-10-CM | POA: Diagnosis not present

## 2020-08-12 DIAGNOSIS — I10 Essential (primary) hypertension: Secondary | ICD-10-CM | POA: Diagnosis not present

## 2020-08-12 DIAGNOSIS — R7303 Prediabetes: Secondary | ICD-10-CM | POA: Diagnosis not present

## 2020-08-12 DIAGNOSIS — Z0000 Encounter for general adult medical examination without abnormal findings: Secondary | ICD-10-CM | POA: Diagnosis not present

## 2020-08-17 ENCOUNTER — Other Ambulatory Visit: Payer: Self-pay

## 2020-08-17 ENCOUNTER — Encounter: Payer: Self-pay | Admitting: Podiatry

## 2020-08-17 ENCOUNTER — Ambulatory Visit (INDEPENDENT_AMBULATORY_CARE_PROVIDER_SITE_OTHER): Payer: Medicare Other | Admitting: Podiatry

## 2020-08-17 DIAGNOSIS — B351 Tinea unguium: Secondary | ICD-10-CM | POA: Diagnosis not present

## 2020-08-17 DIAGNOSIS — M79675 Pain in left toe(s): Secondary | ICD-10-CM

## 2020-08-17 DIAGNOSIS — M79674 Pain in right toe(s): Secondary | ICD-10-CM

## 2020-08-17 NOTE — Progress Notes (Signed)

## 2020-08-21 ENCOUNTER — Encounter: Payer: Self-pay | Admitting: Neurology

## 2020-09-09 DIAGNOSIS — H40013 Open angle with borderline findings, low risk, bilateral: Secondary | ICD-10-CM | POA: Diagnosis not present

## 2020-09-09 DIAGNOSIS — H2513 Age-related nuclear cataract, bilateral: Secondary | ICD-10-CM | POA: Diagnosis not present

## 2020-09-09 DIAGNOSIS — H18593 Other hereditary corneal dystrophies, bilateral: Secondary | ICD-10-CM | POA: Diagnosis not present

## 2020-09-09 DIAGNOSIS — H5213 Myopia, bilateral: Secondary | ICD-10-CM | POA: Diagnosis not present

## 2020-11-12 ENCOUNTER — Other Ambulatory Visit: Payer: Self-pay

## 2020-11-12 ENCOUNTER — Encounter: Payer: Self-pay | Admitting: Neurology

## 2020-11-12 ENCOUNTER — Ambulatory Visit (INDEPENDENT_AMBULATORY_CARE_PROVIDER_SITE_OTHER): Payer: Medicare Other | Admitting: Neurology

## 2020-11-12 VITALS — BP 121/60 | HR 101 | Ht 63.5 in | Wt 115.4 lb

## 2020-11-12 DIAGNOSIS — F0390 Unspecified dementia without behavioral disturbance: Secondary | ICD-10-CM | POA: Diagnosis not present

## 2020-11-12 DIAGNOSIS — F03A0 Unspecified dementia, mild, without behavioral disturbance, psychotic disturbance, mood disturbance, and anxiety: Secondary | ICD-10-CM

## 2020-11-12 MED ORDER — DONEPEZIL HCL 10 MG PO TABS: ORAL_TABLET | ORAL | 11 refills | Status: DC

## 2020-11-12 NOTE — Progress Notes (Signed)
NEUROLOGY CONSULTATION NOTE  Denise Olson MRN: [id number] DOB: [birth year]  Referring provider: Dr. Wenda Olson Primary care provider: Dr. Wenda Olson  Reason for consult:  memory loss  Dear Denise Olson:  Thank you for your kind referral of Denise Olson for consultation of the above symptoms. Although her history is well known to you, please allow me to reiterate it for the purpose of our medical record. The patient was accompanied to the clinic by her husband Denise Olson who also provides collateral information. Records and images were personally reviewed where available.   HISTORY OF PRESENT ILLNESS: This is a 75 year old right-handed woman with a history of hypertension,hyperlipidemia, breast cancer s/p chemotherapy, presenting for evaluation of memory loss. She states that since she stopped working, "we don't do anything that I have to remember." Her husband started noticing memory changes after she fractured her right arm after a fall in 2016. She had always done the taxes but had confusion filling in the blanks when she did taxes in 2017. Symptoms have progressively worsened over time, but accelerated the past 3 months. He notes that they used to watch the news and work on the computer every evening, but she stopped doing this a year ago, then he noticed that she had not cleaned out her email inbox since 04/20/2019. For years after their retirement, she would always get up and walk the dog, but the past 3 months she would wake him up saying she could not get out, she could not figure out the back door lock. He took over finances completely a year ago. She denies missing medication but would ask her husband if she already took her medication, so the past 2 months he has been supervising. She became upset saying she does not like the word "supervising" her. She stopped driving a year ago, her husband reports she got lost driving a few times in 2018. After she broke her arm, she lost her  ability to orient herself in [place]. He reports "everyday is the same," she asks what day or year it is. She smokes cigarettes but has forgotten how to use a lighter, asking for a match so she can light her lighter, or asking where the lighter is. He notes she had been isolated during the pandemic, now that she has been able to get out, she does not want to stay at home, "she needs to get out of the house right now." For instance, this morning she kept telling her husband that if they don't leave "right now" she would cancel her appointment. She would be fine in 30 seconds. She is outgoing with other people, but when alone at home she has dark moods, telling her husband she "feels spooky." No hallucinations. She used to work as a Statistician to a Veterinary surgeon. Her mother had memory issues in her 89s. No history of concussions or alcohol use. She states mood is "pretty happy." Sleep is good, no REM behaviors.   She denies any headaches, dizziness, diplopia, dysarthria, dysphagia, neck/back pain, focal numbness/tingling/weakness, bowel/bladder dysfunction. No anosmia, tremors, no falls.   Laboratory Data: 07/2020: TSH 1.42  B12 623  PAST MEDICAL HISTORY: Past Medical History:  Diagnosis Date   Breast cancer ([place])    Cancer ([place])    Personal history of chemotherapy     PAST SURGICAL HISTORY: Past Surgical History:  Procedure Laterality Date   HYSTEROTOMY     MASTECTOMY Left     MEDICATIONS: Current Outpatient Medications  on File Prior to Visit  Medication Sig Dispense Refill   aspirin 81 MG chewable tablet Chew by mouth daily.     atorvastatin (LIPITOR) 40 MG tablet Take 1 tablet by mouth daily.     cholecalciferol (VITAMIN D3) 25 MCG (1000 UNIT) tablet Take 1,000 Units by mouth daily.     raloxifene (EVISTA) 60 MG tablet Take 60 mg by mouth daily.     ramipril (ALTACE) 5 MG capsule Take 5 mg by mouth daily.     No current facility-administered medications on file prior to visit.     ALLERGIES: No Known Allergies  FAMILY HISTORY: Family History  Problem Relation Age of Onset   Breast cancer Neg Hx     SOCIAL HISTORY: Social History   Socioeconomic History   Marital status: Married    Spouse name: Not on file   Number of children: Not on file   Years of education: Not on file   Highest education level: Not on file  Occupational History   Not on file  Tobacco Use   Smoking status: Some Days    Packs/day: 0.50    Types: Cigarettes   Smokeless tobacco: Never  Substance and Sexual Activity   Alcohol use: No   Drug use: Never   Sexual activity: Not on file  Other Topics Concern   Not on file  Social History Narrative   Not on file   Social Determinants of Health   Financial Resource Strain: Not on file  Food Insecurity: Not on file  Transportation Needs: Not on file  Physical Activity: Not on file  Stress: Not on file  Social Connections: Not on file  Intimate Partner Violence: Not on file     PHYSICAL EXAM: Vitals:   11/12/20 1018  BP: 121/60  Pulse: (!) 101  SpO2: 97%   General: No acute distress Head:  Normocephalic/atraumatic Skin/Extremities: No rash, no edema Neurological Exam: Mental status: alert and oriented to person, city. No dysarthria or aphasia, Fund of knowledge is reduced.  Recent and remote memory are impaired.  Attention and concentration are reduced, needs redirection several times during the visit.    Able to name objects and repeat phrases. Significant difficulty with executive function/visuospatial tasks. [facility] 14/30 Montreal Cognitive Assessment  11/12/2020  Visuospatial/ Executive (0/5) 1  Naming (0/3) 3  Attention: Read list of digits (0/2) 2  Attention: Read list of letters (0/1) 1  Attention: Serial 7 subtraction starting at 100 (0/3) 2  Language: Repeat phrase (0/2) 2  Language : Fluency (0/1) 1  Abstraction (0/2) 1  Delayed Recall (0/5) 0  Orientation (0/6) 1  Total 14    Cranial nerves: CN I: not  tested CN II: pupils equal, round and reactive to light, visual fields intact CN III, IV, VI:  full range of motion, no nystagmus, no ptosis CN V: facial sensation intact CN VII: upper and lower face symmetric CN VIII: hearing intact to conversation CN XI: sternocleidomastoid and trapezius muscles intact CN XII: tongue midline Bulk & Tone: normal, no fasciculations. Motor: 5/5 throughout with no pronator drift. Sensation: intact to light touch, cold, pin, vibration sense.  No extinction to double simultaneous stimulation.  Romberg test negative Deep Tendon Reflexes: +1 throughout Cerebellar: no incoordination on finger to nose testing Gait: narrow-based and steady, able to tandem walk adequately. Tremor: none   IMPRESSION: This is a 75 year old right-handed woman with a history of hypertension,hyperlipidemia, breast cancer s/p chemotherapy, presenting for evaluation of memory loss  that has progressively been worsening over the past 5 years with difficulties with complex tasks and more confusion with daily activities. Neurological exam non-focal, MOCA score today 14/30. Discussed the diagnosis of dementia, etiology possibly Alzheimer's disease. MRI brain with and without contrast will be ordered to assess for underlying structural abnormality and assess vascular load. She needed repeated redirection during the visit. We discussed starting Donepezil, including side effects and expectations, start Donepezil 10mg  1/2 tab daily for 2 weeks, then increase to 1 tablet daily. Husband advised to check behind her with medications. She would likely do well with adult day programs, resources provided. She does not drive. Follow-up in 6 months, call for any changes.   Thank you for allowing me to participate in the care of this patient. Please do not hesitate to call for any questions or concerns.   Ellouise Newer, M.D.  CC: Denise. Lysle Olson

## 2020-11-12 NOTE — Patient Instructions (Signed)
Schedule MRI brain without contrast  2. Start Donepezil 10mg : Take 1/2 tablet daily for 2 weeks, then increase to 1 tablet daily  3. Please look into [facility] program for social activities during the day  4. Follow-up in 6 months, call for any changes   FALL PRECAUTIONS: Be cautious when walking. Scan the area for obstacles that may increase the risk of trips and falls. When getting up in the mornings, sit up at the edge of the bed for a few minutes before getting out of bed. Consider elevating the bed at the head end to avoid drop of blood pressure when getting up. Walk always in a well-lit room (use night lights in the walls). Avoid area rugs or power cords from appliances in the middle of the walkways. Use a walker or a cane if necessary and consider physical therapy for balance exercise. Get your eyesight checked regularly.   HOME SAFETY: Consider the safety of the kitchen when operating appliances like stoves, microwave oven, and blender. Consider having supervision and share cooking responsibilities until no longer able to participate in those. Accidents with firearms and other hazards in the house should be identified and addressed as well.   ABILITY TO BE LEFT ALONE: If patient is unable to contact 911 operator, consider using LifeLine, or when the need is there, arrange for someone to stay with patients. Smoking is a fire hazard, consider supervision or cessation. Risk of wandering should be assessed by caregiver and if detected at any point, supervision and safe proof recommendations should be instituted.  MEDICATION SUPERVISION: Inability to self-administer medication needs to be constantly addressed. Implement a mechanism to ensure safe administration of the medications.  RECOMMENDATIONS FOR ALL PATIENTS WITH MEMORY PROBLEMS: 1. Continue to exercise (Recommend 30 minutes of walking everyday, or 3 hours every week) 2. Increase social interactions - continue going to [place] and  enjoy social gatherings with friends and family 3. Eat healthy, avoid fried foods and eat more fruits and vegetables 4. Maintain adequate blood pressure, blood sugar, and blood cholesterol level. Reducing the risk of stroke and cardiovascular disease also helps promoting better memory. 5. Avoid stressful situations. Live a simple life and avoid aggravations. Organize your time and prepare for the next day in anticipation. 6. Sleep well, avoid any interruptions of sleep and avoid any distractions in the bedroom that may interfere with adequate sleep quality 7. Avoid sugar, avoid sweets as there is a strong link between excessive sugar intake, diabetes, and cognitive impairment The Mediterranean diet has been shown to help patients reduce the risk of progressive memory disorders and reduces cardiovascular risk. This includes eating fish, eat fruits and green leafy vegetables, nuts like almonds and hazelnuts, walnuts, and also use olive oil. Avoid fast foods and fried foods as much as possible. Avoid sweets and sugar as sugar use has been linked to worsening of memory function.  There is always a concern of gradual progression of memory problems. If this is the case, then we may need to adjust level of care according to patient needs. Support, both to the patient and caregiver, should then be put into place.

## 2020-11-28 ENCOUNTER — Other Ambulatory Visit: Payer: Self-pay

## 2020-11-28 ENCOUNTER — Ambulatory Visit
Admission: RE | Admit: 2020-11-28 | Discharge: 2020-11-28 | Disposition: A | Discharge status: Home / Self Care | Payer: Medicare Other | Source: Ambulatory Visit | Attending: Neurology | Admitting: Neurology

## 2020-11-28 DIAGNOSIS — F03A0 Unspecified dementia, mild, without behavioral disturbance, psychotic disturbance, mood disturbance, and anxiety: Secondary | ICD-10-CM

## 2020-11-28 DIAGNOSIS — R413 Other amnesia: Secondary | ICD-10-CM | POA: Diagnosis not present

## 2020-11-28 DIAGNOSIS — F0390 Unspecified dementia without behavioral disturbance: Secondary | ICD-10-CM | POA: Diagnosis not present

## 2020-11-28 MED ORDER — GADOBENATE DIMEGLUMINE 529 MG/ML IV SOLN
11.0000 mL | Freq: Once | INTRAVENOUS | Status: AC | PRN
  Administered 2020-11-28: 11 mL via INTRAVENOUS

## 2020-12-03 NOTE — Progress Notes (Signed)
No answer at 1104

## 2020-12-04 ENCOUNTER — Other Ambulatory Visit: Payer: Self-pay | Admitting: Internal Medicine

## 2020-12-04 DIAGNOSIS — Z1231 Encounter for screening mammogram for malignant neoplasm of breast: Secondary | ICD-10-CM

## 2020-12-04 NOTE — Progress Notes (Signed)
Husband advised of MRI results, voiced understanding.

## 2020-12-10 DIAGNOSIS — M810 Age-related osteoporosis without current pathological fracture: Secondary | ICD-10-CM | POA: Diagnosis not present

## 2020-12-10 DIAGNOSIS — E46 Unspecified protein-calorie malnutrition: Secondary | ICD-10-CM | POA: Diagnosis not present

## 2020-12-10 DIAGNOSIS — I700 Atherosclerosis of aorta: Secondary | ICD-10-CM | POA: Diagnosis not present

## 2020-12-10 DIAGNOSIS — I10 Essential (primary) hypertension: Secondary | ICD-10-CM | POA: Diagnosis not present

## 2020-12-10 DIAGNOSIS — Z853 Personal history of malignant neoplasm of breast: Secondary | ICD-10-CM | POA: Diagnosis not present

## 2020-12-10 DIAGNOSIS — F0390 Unspecified dementia without behavioral disturbance: Secondary | ICD-10-CM | POA: Diagnosis not present

## 2020-12-10 DIAGNOSIS — E782 Mixed hyperlipidemia: Secondary | ICD-10-CM | POA: Diagnosis not present

## 2020-12-21 ENCOUNTER — Encounter: Payer: Self-pay | Admitting: Podiatry

## 2020-12-21 ENCOUNTER — Ambulatory Visit (INDEPENDENT_AMBULATORY_CARE_PROVIDER_SITE_OTHER): Payer: Medicare Other | Admitting: Podiatry

## 2020-12-21 ENCOUNTER — Other Ambulatory Visit: Payer: Self-pay

## 2020-12-21 DIAGNOSIS — M79675 Pain in left toe(s): Secondary | ICD-10-CM | POA: Diagnosis not present

## 2020-12-21 DIAGNOSIS — M79674 Pain in right toe(s): Secondary | ICD-10-CM

## 2020-12-21 DIAGNOSIS — B351 Tinea unguium: Secondary | ICD-10-CM

## 2020-12-21 NOTE — Progress Notes (Signed)

## 2021-01-22 ENCOUNTER — Other Ambulatory Visit: Payer: Self-pay | Admitting: Internal Medicine

## 2021-01-22 ENCOUNTER — Other Ambulatory Visit: Payer: Self-pay

## 2021-01-22 ENCOUNTER — Ambulatory Visit
Admission: RE | Admit: 2021-01-22 | Discharge: 2021-01-22 | Disposition: A | Discharge status: Home / Self Care | Payer: Medicare Other | Source: Ambulatory Visit | Attending: Internal Medicine | Admitting: Internal Medicine

## 2021-01-22 DIAGNOSIS — Z1231 Encounter for screening mammogram for malignant neoplasm of breast: Secondary | ICD-10-CM

## 2021-03-03 DIAGNOSIS — H18593 Other hereditary corneal dystrophies, bilateral: Secondary | ICD-10-CM | POA: Diagnosis not present

## 2021-03-03 DIAGNOSIS — H2513 Age-related nuclear cataract, bilateral: Secondary | ICD-10-CM | POA: Diagnosis not present

## 2021-03-12 DIAGNOSIS — I10 Essential (primary) hypertension: Secondary | ICD-10-CM | POA: Diagnosis not present

## 2021-03-12 DIAGNOSIS — F0390 Unspecified dementia without behavioral disturbance: Secondary | ICD-10-CM | POA: Diagnosis not present

## 2021-03-12 DIAGNOSIS — I700 Atherosclerosis of aorta: Secondary | ICD-10-CM | POA: Diagnosis not present

## 2021-03-12 DIAGNOSIS — F17210 Nicotine dependence, cigarettes, uncomplicated: Secondary | ICD-10-CM | POA: Diagnosis not present

## 2021-03-12 DIAGNOSIS — E7800 Pure hypercholesterolemia, unspecified: Secondary | ICD-10-CM | POA: Diagnosis not present

## 2021-03-12 DIAGNOSIS — R7303 Prediabetes: Secondary | ICD-10-CM | POA: Diagnosis not present

## 2021-03-12 DIAGNOSIS — M810 Age-related osteoporosis without current pathological fracture: Secondary | ICD-10-CM | POA: Diagnosis not present

## 2021-03-12 DIAGNOSIS — J449 Chronic obstructive pulmonary disease, unspecified: Secondary | ICD-10-CM | POA: Diagnosis not present

## 2021-03-12 DIAGNOSIS — Z853 Personal history of malignant neoplasm of breast: Secondary | ICD-10-CM | POA: Diagnosis not present

## 2021-04-23 ENCOUNTER — Encounter: Payer: Self-pay | Admitting: Podiatry

## 2021-04-23 ENCOUNTER — Ambulatory Visit (INDEPENDENT_AMBULATORY_CARE_PROVIDER_SITE_OTHER): Payer: Medicare Other | Admitting: Podiatry

## 2021-04-23 ENCOUNTER — Other Ambulatory Visit: Payer: Self-pay

## 2021-04-23 DIAGNOSIS — B351 Tinea unguium: Secondary | ICD-10-CM

## 2021-04-23 DIAGNOSIS — M79675 Pain in left toe(s): Secondary | ICD-10-CM | POA: Diagnosis not present

## 2021-04-23 DIAGNOSIS — M79674 Pain in right toe(s): Secondary | ICD-10-CM | POA: Diagnosis not present

## 2021-04-23 NOTE — Progress Notes (Signed)

## 2021-05-19 NOTE — Progress Notes (Signed)
Assessment/Plan:   Mild Dementia likely due to Alzheimer's Disease  Stable from the cognitive standpoint.  She is on donepezil 10 mg daily, tolerating well.   Recommendations:   Discussed safety both in and out of the home.  Discussed the importance of regular daily schedule with inclusion of crossword puzzles to maintain brain function.  Continue to monitor mood by PCP Stay active at least 30 minutes at least 3 times a week.  Naps should be scheduled and should be no longer than 60 minutes and should not occur after 2 PM.  Mediterranean diet is recommended  Control cardiovascular risk factors  Continue donepezil 10 mg daily Side effects were discussed Strongly recommend Adult Day Program  Follow up in 6 months.   Case discussed with Dr. Delice Lesch who agrees with the plan       Subjective:    Denise Olson is a very pleasant 76 y.o. RH female with a history of hypertension, hyperlipidemia, breast cancer status post chemotherapy, and a history of dementia likely due to Alzheimer's disease seen today in follow up for memory loss.  She was last seen at the office on 11/12/2020, at which time MoCA score was 14/30.  She was started on donepezil 10 mg daily, tolerating well.  She is accompanied in the office by her husband who supplements the history.  Previous records as well as any outside records available were reviewed prior to todays visit. MRI brain remarkable for  progressive cerebral atrophy including severe mesial temporal lobe atrophy and and chronic small vessel ischemic disease.  Since her last visit, she reports that her memory "is about the same ".  She continues to repeat the same stories and asked the same questions.  Sometimes she begins a conversation with people "and they do not realize that she has dementia, and they do not want to engage in conversation with her because she is repeating herself ".  She is aware about what room she is in, but she has a hard time finding  objects around the house.  She hides objects around the house.  For example, her husband reports that when she needs a cigarette, she forgets how to light it or where to find need.  Her mood is good, she is "easier to get along with now, because she is less angry and she forgets to fight ".  However, he reports less vibrancy to her.  She sleeps fairly well, denies vivid dreams or sleepwalking.  "May take her a little bit longer to wake her up from a dream, she is more disoriented in the morning.  No hallucinations or paranoia.  She lost interest in showering "she never took a shower every day, the last 1 was in April 2021 ".  Apparently, she takes "bird baths ", and has not taken a full shower in 1-1/2-year.  She does need assistance with dressing, " she may walk outside wearing a T-shirt as underwear ", and she may soil herself at night.  She does not wear a diaper.  She denies constipation or diarrhea.  Her husband is in charge of the medications, since she began to forget some doses.  He states that she missed 2 days of donepezil this week.  He is in charge of the finances as well. Denies headaches, double vision, dizziness, focal numbness or tingling, unilateral weakness, tremors or anosmia. No history of seizures.  Her husband is interested in adult day program at this point to increase her activities and  interaction with other people.    Initial visit 11/12/20:This is a 76 year old right-handed woman with a history of hypertension,hyperlipidemia, breast cancer s/p chemotherapy, presenting for evaluation of memory loss. She states that since she stopped working, "we don't do anything that I have to remember." Her husband started noticing memory changes after she fractured her right arm after a fall in 2016. She had always done the taxes but had confusion filling in the blanks when she did taxes in 2017. Symptoms have progressively worsened over time, but accelerated the past 3 months. He notes that they used  to watch the news and work on the computer every evening, but she stopped doing this a year ago, then he noticed that she had not cleaned out her email inbox since 04/20/2019. For years after their retirement, she would always get up and walk the dog, but the past 3 months she would wake him up saying she could not get out, she could not figure out the back door lock. He took over finances completely a year ago. She denies missing medication but would ask her husband if she already took her medication, so the past 2 months he has been supervising. She became upset saying she does not like the word "supervising" her. She stopped driving a year ago, her husband reports she got lost driving a few times in 2018. After she broke her arm, she lost her ability to orient herself in [place]. He reports "everyday is the same," she asks what day or year it is. She smokes cigarettes but has forgotten how to use a lighter, asking for a match so she can light her lighter, or asking where the lighter is. He notes she had been isolated during the pandemic, now that she has been able to get out, she does not want to stay at home, "she needs to get out of the house right now." For instance, this morning she kept telling her husband that if they don't leave "right now" she would cancel her appointment. She would be fine in 30 seconds. She is outgoing with other people, but when alone at home she has dark moods, telling her husband she "feels spooky." No hallucinations. She used to work as a Statistician to a Veterinary surgeon. Her mother had memory issues in her 26s. No history of concussions or alcohol use. She states mood is "pretty happy." Sleep is good, no REM behaviors.    She denies any headaches, dizziness, diplopia, dysarthria, dysphagia, neck/back pain, focal numbness/tingling/weakness, bowel/bladder dysfunction. No anosmia, tremors, no falls.    Laboratory Data: 07/2020: TSH 1.42  B12 623  PREVIOUS MEDICATIONS:    CURRENT MEDICATIONS:  Outpatient Encounter Medications as of 05/20/2021  Medication Sig   aspirin 81 MG chewable tablet Chew by mouth daily.   atorvastatin (LIPITOR) 40 MG tablet Take 1 tablet by mouth daily.   Calcium Carbonate-Vit D-Min (CALCIUM 1200 PO) Take by mouth.   cholecalciferol (VITAMIN D3) 25 MCG (1000 UNIT) tablet Take 1,000 Units by mouth daily.   mirtazapine (REMERON) 7.5 MG tablet Take 7.5 mg by mouth at bedtime.   raloxifene (EVISTA) 60 MG tablet Take 60 mg by mouth daily.   ramipril (ALTACE) 5 MG capsule Take 5 mg by mouth daily.   [DISCONTINUED] donepezil (ARICEPT) 10 MG tablet Take 1/2 tablet daily for 2 weeks, then increase to 1 tablet daily (Patient taking differently: Take one tablet daily)   donepezil (ARICEPT) 10 MG tablet Take one tablet daily  No facility-administered encounter medications on file as of 05/20/2021.     Objective:     PHYSICAL EXAMINATION:    VITALS:   Vitals:   05/20/21 1055  BP: 99/61  Pulse: (!) 110  SpO2: 100%  Weight: 111 lb (50.3 kg)  Height: 5' 3.5" (1.613 m)    GEN:  The patient appears stated age and is in NAD. HEENT:  Normocephalic, atraumatic.   Neurological examination:  General: NAD, well-groomed, appears stated age.  Slightly disheveled appearance Orientation: The patient is alert. Oriented to person and place, not to date cranial nerves: There is good facial symmetry.The speech is fluent and clear. No aphasia or dysarthria. Fund of knowledge is appropriate. Recent and remote memory are impaired. Attention and concentration are reduced.  Able to name objects and repeat phrases.  Hearing is intact to conversational tone.    Sensation: Sensation is intact to light touch throughout Motor: Strength is at least antigravity x4. Tremors: none  DTR's 2/4 in [facility] Assessment  11/12/2020  Visuospatial/ Executive (0/5) 1  Naming (0/3) 3  Attention: Read list of digits (0/2) 2  Attention: Read list  of letters (0/1) 1  Attention: Serial 7 subtraction starting at 100 (0/3) 2  Language: Repeat phrase (0/2) 2  Language : Fluency (0/1) 1  Abstraction (0/2) 1  Delayed Recall (0/5) 0  Orientation (0/6) 1  Total 14   No flowsheet data found.  No flowsheet data found.     Movement examination: Tone: There is normal tone in the UE/LE Abnormal movements:  no tremor.  No myoclonus.  No asterixis.   Coordination:  There is no decremation with RAM's. Normal finger to nose  Gait and Station: The patient has no difficulty arising out of a deep-seated chair without the use of the hands. The patient's stride length is good.  Gait is cautious and narrow.     Total time spent on today's visit was  35 minutes, including both face-to-face time and nonface-to-face time. Time included that spent on review of records (prior notes available to me/labs/imaging if pertinent), discussing treatment and goals, answering patient's questions and coordinating care.  Cc:  [name], MD Sharene Butters, PA-C

## 2021-05-20 ENCOUNTER — Ambulatory Visit (INDEPENDENT_AMBULATORY_CARE_PROVIDER_SITE_OTHER): Payer: Medicare Other | Admitting: Physician Assistant

## 2021-05-20 ENCOUNTER — Other Ambulatory Visit: Payer: Self-pay

## 2021-05-20 ENCOUNTER — Encounter: Payer: Self-pay | Admitting: Physician Assistant

## 2021-05-20 VITALS — BP 99/61 | HR 110 | Ht 63.5 in | Wt 111.0 lb

## 2021-05-20 DIAGNOSIS — F0280 Dementia in other diseases classified elsewhere without behavioral disturbance: Secondary | ICD-10-CM | POA: Diagnosis not present

## 2021-05-20 DIAGNOSIS — G309 Alzheimer's disease, unspecified: Secondary | ICD-10-CM

## 2021-05-20 MED ORDER — DONEPEZIL HCL 10 MG PO TABS: ORAL_TABLET | ORAL | 11 refills | Status: DC

## 2021-05-20 NOTE — Patient Instructions (Signed)
° °  2. Continue Donepezil 10mg   1 tablet daily  3. Please look into [facility] for social activities during the day  4. Follow-up in 6 months, call for any changes   FALL PRECAUTIONS: Be cautious when walking. Scan the area for obstacles that may increase the risk of trips and falls. When getting up in the mornings, sit up at the edge of the bed for a few minutes before getting out of bed. Consider elevating the bed at the head end to avoid drop of blood pressure when getting up. Walk always in a well-lit room (use night lights in the walls). Avoid area rugs or power cords from appliances in the middle of the walkways. Use a walker or a cane if necessary and consider physical therapy for balance exercise. Get your eyesight checked regularly.   HOME SAFETY: Consider the safety of the kitchen when operating appliances like stoves, microwave oven, and blender. Consider having supervision and share cooking responsibilities until no longer able to participate in those. Accidents with firearms and other hazards in the house should be identified and addressed as well.   ABILITY TO BE LEFT ALONE: If patient is unable to contact 911 operator, consider using LifeLine, or when the need is there, arrange for someone to stay with patients. Smoking is a fire hazard, consider supervision or cessation. Risk of wandering should be assessed by caregiver and if detected at any point, supervision and safe proof recommendations should be instituted.  MEDICATION SUPERVISION: Inability to self-administer medication needs to be constantly addressed. Implement a mechanism to ensure safe administration of the medications.  RECOMMENDATIONS FOR ALL PATIENTS WITH MEMORY PROBLEMS: 1. Continue to exercise (Recommend 30 minutes of walking everyday, or 3 hours every week) 2. Increase social interactions - continue going to [place] and enjoy social gatherings with friends and family 3. Eat healthy, avoid fried foods  and eat more fruits and vegetables 4. Maintain adequate blood pressure, blood sugar, and blood cholesterol level. Reducing the risk of stroke and cardiovascular disease also helps promoting better memory. 5. Avoid stressful situations. Live a simple life and avoid aggravations. Organize your time and prepare for the next day in anticipation. 6. Sleep well, avoid any interruptions of sleep and avoid any distractions in the bedroom that may interfere with adequate sleep quality 7. Avoid sugar, avoid sweets as there is a strong link between excessive sugar intake, diabetes, and cognitive impairment The Mediterranean diet has been shown to help patients reduce the risk of progressive memory disorders and reduces cardiovascular risk. This includes eating fish, eat fruits and green leafy vegetables, nuts like almonds and hazelnuts, walnuts, and also use olive oil. Avoid fast foods and fried foods as much as possible. Avoid sweets and sugar as sugar use has been linked to worsening of memory function.  There is always a concern of gradual progression of memory problems. If this is the case, then we may need to adjust level of care according to patient needs. Support, both to the patient and caregiver, should then be put into place.

## 2021-06-01 ENCOUNTER — Encounter: Payer: Self-pay | Admitting: Neurology

## 2021-06-08 ENCOUNTER — Ambulatory Visit: Payer: Medicare Other | Admitting: Neurology

## 2021-07-28 ENCOUNTER — Ambulatory Visit (INDEPENDENT_AMBULATORY_CARE_PROVIDER_SITE_OTHER): Payer: Medicare Other | Admitting: Podiatry

## 2021-07-28 DIAGNOSIS — B351 Tinea unguium: Secondary | ICD-10-CM

## 2021-07-28 DIAGNOSIS — M79675 Pain in left toe(s): Secondary | ICD-10-CM

## 2021-07-28 DIAGNOSIS — M79674 Pain in right toe(s): Secondary | ICD-10-CM

## 2021-07-28 NOTE — Progress Notes (Signed)
Patient believed she was coming into the office for an abdomen check.  She does want her nails cut.  She also refused to allow her husband come in the treatment room. ? ? ?Gardiner Barefoot DPM  ?

## 2021-08-17 DIAGNOSIS — E46 Unspecified protein-calorie malnutrition: Secondary | ICD-10-CM | POA: Diagnosis not present

## 2021-08-17 DIAGNOSIS — R7303 Prediabetes: Secondary | ICD-10-CM | POA: Diagnosis not present

## 2021-08-17 DIAGNOSIS — J449 Chronic obstructive pulmonary disease, unspecified: Secondary | ICD-10-CM | POA: Diagnosis not present

## 2021-08-17 DIAGNOSIS — I10 Essential (primary) hypertension: Secondary | ICD-10-CM | POA: Diagnosis not present

## 2021-08-17 DIAGNOSIS — F17210 Nicotine dependence, cigarettes, uncomplicated: Secondary | ICD-10-CM | POA: Diagnosis not present

## 2021-08-17 DIAGNOSIS — Z1389 Encounter for screening for other disorder: Secondary | ICD-10-CM | POA: Diagnosis not present

## 2021-08-17 DIAGNOSIS — Z853 Personal history of malignant neoplasm of breast: Secondary | ICD-10-CM | POA: Diagnosis not present

## 2021-08-17 DIAGNOSIS — F0280 Dementia in other diseases classified elsewhere without behavioral disturbance: Secondary | ICD-10-CM | POA: Diagnosis not present

## 2021-08-17 DIAGNOSIS — G309 Alzheimer's disease, unspecified: Secondary | ICD-10-CM | POA: Diagnosis not present

## 2021-08-17 DIAGNOSIS — E782 Mixed hyperlipidemia: Secondary | ICD-10-CM | POA: Diagnosis not present

## 2021-08-17 DIAGNOSIS — Z0000 Encounter for general adult medical examination without abnormal findings: Secondary | ICD-10-CM | POA: Diagnosis not present

## 2021-08-17 DIAGNOSIS — M810 Age-related osteoporosis without current pathological fracture: Secondary | ICD-10-CM | POA: Diagnosis not present

## 2021-11-03 ENCOUNTER — Ambulatory Visit (INDEPENDENT_AMBULATORY_CARE_PROVIDER_SITE_OTHER): Payer: Medicare Other | Admitting: Podiatry

## 2021-11-03 ENCOUNTER — Encounter: Payer: Self-pay | Admitting: Podiatry

## 2021-11-03 DIAGNOSIS — M79674 Pain in right toe(s): Secondary | ICD-10-CM

## 2021-11-03 DIAGNOSIS — M79675 Pain in left toe(s): Secondary | ICD-10-CM | POA: Diagnosis not present

## 2021-11-03 DIAGNOSIS — F0280 Dementia in other diseases classified elsewhere without behavioral disturbance: Secondary | ICD-10-CM

## 2021-11-03 DIAGNOSIS — B351 Tinea unguium: Secondary | ICD-10-CM

## 2021-11-03 DIAGNOSIS — G309 Alzheimer's disease, unspecified: Secondary | ICD-10-CM

## 2021-11-03 NOTE — Progress Notes (Signed)

## 2021-11-04 ENCOUNTER — Telehealth: Payer: Self-pay | Admitting: Physician Assistant

## 2021-11-04 NOTE — Telephone Encounter (Signed)
Husband is going to call PCP for an appt, we can go from there after assessment.

## 2021-11-04 NOTE — Telephone Encounter (Signed)
Patients husband called and stated he thinks his wife needs an assessment.  He stated she is very week, sleeps a lot, and is delusional.  He stated she couldn't walk on her own today.  He stated hes not sure if she can wait until her July 31 appt.

## 2021-11-05 DIAGNOSIS — R5383 Other fatigue: Secondary | ICD-10-CM | POA: Diagnosis not present

## 2021-11-05 DIAGNOSIS — G309 Alzheimer's disease, unspecified: Secondary | ICD-10-CM | POA: Diagnosis not present

## 2021-11-05 DIAGNOSIS — R410 Disorientation, unspecified: Secondary | ICD-10-CM | POA: Diagnosis not present

## 2021-11-05 DIAGNOSIS — E46 Unspecified protein-calorie malnutrition: Secondary | ICD-10-CM | POA: Diagnosis not present

## 2021-11-05 DIAGNOSIS — I700 Atherosclerosis of aorta: Secondary | ICD-10-CM | POA: Diagnosis not present

## 2021-11-11 ENCOUNTER — Ambulatory Visit: Payer: Medicare Other | Admitting: Neurology

## 2021-11-13 ENCOUNTER — Emergency Department (HOSPITAL_COMMUNITY): Payer: Medicare Other

## 2021-11-13 ENCOUNTER — Inpatient Hospital Stay (HOSPITAL_COMMUNITY)
Admission: EM | Admit: 2021-11-13 | Discharge: 2021-11-18 | DRG: 640 | Disposition: A | Discharge status: Skilled Nursing Facility | Payer: Medicare Other | Attending: Internal Medicine | Admitting: Internal Medicine

## 2021-11-13 ENCOUNTER — Encounter (HOSPITAL_COMMUNITY): Payer: Self-pay | Admitting: Emergency Medicine

## 2021-11-13 ENCOUNTER — Other Ambulatory Visit: Payer: Self-pay

## 2021-11-13 ENCOUNTER — Observation Stay (HOSPITAL_COMMUNITY): Payer: Medicare Other

## 2021-11-13 DIAGNOSIS — G8929 Other chronic pain: Secondary | ICD-10-CM | POA: Diagnosis present

## 2021-11-13 DIAGNOSIS — Z79899 Other long term (current) drug therapy: Secondary | ICD-10-CM | POA: Diagnosis not present

## 2021-11-13 DIAGNOSIS — F0282 Dementia in other diseases classified elsewhere, unspecified severity, with psychotic disturbance: Secondary | ICD-10-CM | POA: Diagnosis present

## 2021-11-13 DIAGNOSIS — G9341 Metabolic encephalopathy: Secondary | ICD-10-CM | POA: Diagnosis present

## 2021-11-13 DIAGNOSIS — F17200 Nicotine dependence, unspecified, uncomplicated: Secondary | ICD-10-CM | POA: Diagnosis present

## 2021-11-13 DIAGNOSIS — R0902 Hypoxemia: Secondary | ICD-10-CM | POA: Diagnosis not present

## 2021-11-13 DIAGNOSIS — B351 Tinea unguium: Secondary | ICD-10-CM | POA: Diagnosis present

## 2021-11-13 DIAGNOSIS — F17210 Nicotine dependence, cigarettes, uncomplicated: Secondary | ICD-10-CM | POA: Diagnosis present

## 2021-11-13 DIAGNOSIS — Z20822 Contact with and (suspected) exposure to covid-19: Secondary | ICD-10-CM | POA: Diagnosis present

## 2021-11-13 DIAGNOSIS — R1312 Dysphagia, oropharyngeal phase: Secondary | ICD-10-CM | POA: Diagnosis present

## 2021-11-13 DIAGNOSIS — Z9012 Acquired absence of left breast and nipple: Secondary | ICD-10-CM

## 2021-11-13 DIAGNOSIS — R627 Adult failure to thrive: Secondary | ICD-10-CM | POA: Diagnosis present

## 2021-11-13 DIAGNOSIS — Z7401 Bed confinement status: Secondary | ICD-10-CM | POA: Diagnosis not present

## 2021-11-13 DIAGNOSIS — R4182 Altered mental status, unspecified: Secondary | ICD-10-CM | POA: Diagnosis not present

## 2021-11-13 DIAGNOSIS — R2681 Unsteadiness on feet: Secondary | ICD-10-CM | POA: Diagnosis present

## 2021-11-13 DIAGNOSIS — M2550 Pain in unspecified joint: Secondary | ICD-10-CM | POA: Diagnosis not present

## 2021-11-13 DIAGNOSIS — E782 Mixed hyperlipidemia: Secondary | ICD-10-CM | POA: Diagnosis present

## 2021-11-13 DIAGNOSIS — E860 Dehydration: Secondary | ICD-10-CM | POA: Diagnosis present

## 2021-11-13 DIAGNOSIS — G309 Alzheimer's disease, unspecified: Secondary | ICD-10-CM | POA: Diagnosis present

## 2021-11-13 DIAGNOSIS — R531 Weakness: Secondary | ICD-10-CM

## 2021-11-13 DIAGNOSIS — R32 Unspecified urinary incontinence: Secondary | ICD-10-CM | POA: Diagnosis present

## 2021-11-13 DIAGNOSIS — F0280 Dementia in other diseases classified elsewhere without behavioral disturbance: Secondary | ICD-10-CM | POA: Diagnosis not present

## 2021-11-13 DIAGNOSIS — I10 Essential (primary) hypertension: Secondary | ICD-10-CM | POA: Diagnosis present

## 2021-11-13 DIAGNOSIS — M25511 Pain in right shoulder: Secondary | ICD-10-CM | POA: Diagnosis not present

## 2021-11-13 DIAGNOSIS — R102 Pelvic and perineal pain: Secondary | ICD-10-CM | POA: Diagnosis not present

## 2021-11-13 DIAGNOSIS — M25519 Pain in unspecified shoulder: Secondary | ICD-10-CM | POA: Diagnosis not present

## 2021-11-13 DIAGNOSIS — J449 Chronic obstructive pulmonary disease, unspecified: Secondary | ICD-10-CM | POA: Diagnosis present

## 2021-11-13 DIAGNOSIS — G9340 Encephalopathy, unspecified: Secondary | ICD-10-CM | POA: Diagnosis present

## 2021-11-13 DIAGNOSIS — R41841 Cognitive communication deficit: Secondary | ICD-10-CM | POA: Diagnosis present

## 2021-11-13 DIAGNOSIS — R079 Chest pain, unspecified: Secondary | ICD-10-CM | POA: Diagnosis not present

## 2021-11-13 DIAGNOSIS — Z9221 Personal history of antineoplastic chemotherapy: Secondary | ICD-10-CM

## 2021-11-13 DIAGNOSIS — Z7982 Long term (current) use of aspirin: Secondary | ICD-10-CM | POA: Diagnosis not present

## 2021-11-13 DIAGNOSIS — R778 Other specified abnormalities of plasma proteins: Secondary | ICD-10-CM | POA: Diagnosis present

## 2021-11-13 DIAGNOSIS — M6281 Muscle weakness (generalized): Secondary | ICD-10-CM | POA: Diagnosis present

## 2021-11-13 DIAGNOSIS — R001 Bradycardia, unspecified: Secondary | ICD-10-CM | POA: Diagnosis not present

## 2021-11-13 DIAGNOSIS — Z853 Personal history of malignant neoplasm of breast: Secondary | ICD-10-CM | POA: Diagnosis not present

## 2021-11-13 DIAGNOSIS — R7989 Other specified abnormal findings of blood chemistry: Secondary | ICD-10-CM

## 2021-11-13 DIAGNOSIS — S3993XA Unspecified injury of pelvis, initial encounter: Secondary | ICD-10-CM | POA: Diagnosis not present

## 2021-11-13 LAB — CBC
HCT: 46.3 % — ABNORMAL HIGH (ref 36.0–46.0)
Hemoglobin: 15.4 g/dL — ABNORMAL HIGH (ref 12.0–15.0)
MCH: 30.4 pg (ref 26.0–34.0)
MCHC: 33.3 g/dL (ref 30.0–36.0)
MCV: 91.3 fL (ref 80.0–100.0)
Platelets: 377 10*3/uL (ref 150–400)
RBC: 5.07 MIL/uL (ref 3.87–5.11)
RDW: 13.8 % (ref 11.5–15.5)
WBC: 9.7 10*3/uL (ref 4.0–10.5)
nRBC: 0 % (ref 0.0–0.2)

## 2021-11-13 LAB — URINALYSIS, ROUTINE W REFLEX MICROSCOPIC
Bilirubin Urine: NEGATIVE
Glucose, UA: NEGATIVE mg/dL
Hgb urine dipstick: NEGATIVE
Ketones, ur: 20 mg/dL — AB
Nitrite: NEGATIVE
Protein, ur: 30 mg/dL — AB
Specific Gravity, Urine: 1.02 (ref 1.005–1.030)
pH: 5 (ref 5.0–8.0)

## 2021-11-13 LAB — COMPREHENSIVE METABOLIC PANEL
ALT: 14 U/L (ref 0–44)
AST: 25 U/L (ref 15–41)
Albumin: 3.7 g/dL (ref 3.5–5.0)
Alkaline Phosphatase: 79 U/L (ref 38–126)
Anion gap: 12 (ref 5–15)
BUN: 9 mg/dL (ref 8–23)
CO2: 23 mmol/L (ref 22–32)
Calcium: 9.3 mg/dL (ref 8.9–10.3)
Chloride: 104 mmol/L (ref 98–111)
Creatinine, Ser: 0.9 mg/dL (ref 0.44–1.00)
GFR, Estimated: >60 mL/min (ref >60)
Glucose, Bld: 119 mg/dL — ABNORMAL HIGH (ref 70–99)
Potassium: 4 mmol/L (ref 3.5–5.1)
Sodium: 139 mmol/L (ref 135–145)
Total Bilirubin: 0.9 mg/dL (ref 0.3–1.2)
Total Protein: 7.5 g/dL (ref 6.5–8.1)

## 2021-11-13 LAB — ETHANOL: Alcohol, Ethyl (B): <10 mg/dL (ref <10)

## 2021-11-13 LAB — TSH: TSH: 0.739 u[IU]/mL (ref 0.350–4.500)

## 2021-11-13 LAB — AMMONIA: Ammonia: 23 umol/L (ref 9–35)

## 2021-11-13 LAB — VITAMIN B12: Vitamin B-12: 335 pg/mL (ref 180–914)

## 2021-11-13 LAB — TROPONIN I (HIGH SENSITIVITY)
Troponin I (High Sensitivity): 57 ng/L — ABNORMAL HIGH (ref <18)
Troponin I (High Sensitivity): 52 ng/L — ABNORMAL HIGH (ref <18)

## 2021-11-13 LAB — CK: Total CK: 119 U/L (ref 38–234)

## 2021-11-13 MED ORDER — DONEPEZIL HCL 10 MG PO TABS
10.0000 mg | ORAL_TABLET | Freq: Every day | ORAL | Status: DC
  Administered 2021-11-13 – 2021-11-17 (×5): 10 mg via ORAL
  Filled 2021-11-13 (×6): qty 1

## 2021-11-13 MED ORDER — RALOXIFENE HCL 60 MG PO TABS
60.0000 mg | ORAL_TABLET | Freq: Every day | ORAL | Status: DC
Start: 2021-11-13 — End: 2021-11-18
  Administered 2021-11-13 – 2021-11-18 (×6): 60 mg via ORAL
  Filled 2021-11-13 (×6): qty 1

## 2021-11-13 MED ORDER — MIRTAZAPINE 15 MG PO TABS
7.5000 mg | ORAL_TABLET | Freq: Every day | ORAL | Status: DC
  Administered 2021-11-13 – 2021-11-17 (×5): 7.5 mg via ORAL
  Filled 2021-11-13 (×6): qty 1

## 2021-11-13 MED ORDER — RAMIPRIL 5 MG PO CAPS
5.0000 mg | ORAL_CAPSULE | Freq: Every day | ORAL | Status: DC
  Administered 2021-11-13 – 2021-11-18 (×6): 5 mg via ORAL
  Filled 2021-11-13 (×7): qty 1

## 2021-11-13 MED ORDER — ATORVASTATIN CALCIUM 40 MG PO TABS
40.0000 mg | ORAL_TABLET | Freq: Every day | ORAL | Status: DC
  Administered 2021-11-14 – 2021-11-18 (×5): 40 mg via ORAL
  Filled 2021-11-13 (×5): qty 1

## 2021-11-13 MED ORDER — ASPIRIN 81 MG PO CHEW
81.0000 mg | CHEWABLE_TABLET | Freq: Every day | ORAL | Status: DC
  Administered 2021-11-14 – 2021-11-18 (×5): 81 mg via ORAL
  Filled 2021-11-13 (×5): qty 1

## 2021-11-13 MED ORDER — LACTATED RINGERS IV SOLN: INTRAVENOUS | Status: DC

## 2021-11-13 MED ORDER — ACETAMINOPHEN 650 MG RE SUPP: 650.0000 mg | Freq: Four times a day (QID) | RECTAL | Status: DC | PRN

## 2021-11-13 MED ORDER — ACETAMINOPHEN 325 MG PO TABS: 650.0000 mg | ORAL_TABLET | Freq: Four times a day (QID) | ORAL | Status: DC | PRN

## 2021-11-13 MED ORDER — ENOXAPARIN SODIUM 30 MG/0.3ML IJ SOSY
30.0000 mg | PREFILLED_SYRINGE | INTRAMUSCULAR | Status: DC
  Administered 2021-11-13: 30 mg via SUBCUTANEOUS
  Filled 2021-11-13: qty 0.3

## 2021-11-13 MED ORDER — ACETAMINOPHEN 325 MG PO TABS
650.0000 mg | ORAL_TABLET | Freq: Four times a day (QID) | ORAL | Status: AC
  Administered 2021-11-14: 650 mg via ORAL
  Filled 2021-11-13: qty 2

## 2021-11-13 MED ORDER — HYDRALAZINE HCL 20 MG/ML IJ SOLN: 2.0000 mg | Freq: Four times a day (QID) | INTRAMUSCULAR | Status: DC | PRN

## 2021-11-13 MED ORDER — SODIUM CHLORIDE 0.9 % IV BOLUS
1000.0000 mL | Freq: Once | INTRAVENOUS | Status: AC
  Administered 2021-11-13: 1000 mL via INTRAVENOUS

## 2021-11-13 NOTE — ED Triage Notes (Signed)
Pt BIB [facility] from home due to being more altered than usual.  She slid down out of her chair and was found on her back.  Husband states she was on the floor for the past 24 hours.  Hx of dementia.  20g left AC.  49m of NS.  VS BP 132/80, CBG 114, Spo2 100%

## 2021-11-13 NOTE — Assessment & Plan Note (Signed)
Continue lipitor daily  ?

## 2021-11-13 NOTE — ED Notes (Signed)
Patient transported to CT 

## 2021-11-13 NOTE — Consult Note (Signed)
NEUROLOGY CONSULTATION NOTE   Date of service: November 13, 2021 Patient Name: Denise Olson MRN:  [id number] DOB:  04-Feb-1946 Reason for consult: "progressive acute decline in ADL in the setting of Alzheimers dementia " Requesting Provider: Orma Flaming, MD _ _ _   _ __   _ __ _ _  __ __   _ __   __ _  History of Present Illness  Denise Olson is a 76 y.o. female with PMH significant for alzheimers dementia, breast cancer, COPD, HTN, HLD who presents with progressived decline in ability to do ADLs and failure to thrive.  I spoke to her husband who reports that she was diagnosed with alzheimers about 2 years ago. She was started on a pill for her memory. She established with Dr. Delice Lesch. She was started on mirtazapine for appetite. Over the last 3 months, been getting feeble, losing muscle mass. Has been forgetful. Wandered out and got lost once in June and then in July 2023. Over the last month, has been progressively getting weaker, excessive sleepiness. Got tested for UTI and was negative. She improved a little on Friday. She has been sleeping in her chair over the last 6 months. Beeing eating sweetes and candy. Over the last couple of days, has been hallucinating. Usually is very chatty and talks to people. Over the last 24 hours, on the floor, sleeping and yelled expletives at husband when he attempted to get her up. She was still sleeping on the floor so he brought her in.  Workup here with CTH with no acute abnormaltiies. Read as progresive and potential NPH but not different and low suspicion for NPH when comparing to MRI Brain from Aug 2022. Has been compliant with Aricept. UA not cocnerning for a UTI, No Pneumonia on CXR.  Neurology consulted for further evaluation and management of progressive dementia.  Patient is unable to provide much meaningful history, she wants to sleep and be left alone. She will follow one step commands.    ROS   Unable to get ROS, patient just wants to rest  and does not provide much history.  Past History   Past Medical History:  Diagnosis Date   Breast cancer ([place])    Cancer ([place])    Personal history of chemotherapy    Past Surgical History:  Procedure Laterality Date   HYSTEROTOMY     MASTECTOMY Left    over 20 years ago   Family History  Problem Relation Age of Onset   Breast cancer Neg Hx    Social History   Socioeconomic History   Marital status: Married    Spouse name: Not on file   Number of children: Not on file   Years of education: Not on file   Highest education level: Not on file  Occupational History   Not on file  Tobacco Use   Smoking status: Some Days    Packs/day: 0.50    Types: Cigarettes   Smokeless tobacco: Never  Vaping Use   Vaping Use: Never used  Substance and Sexual Activity   Alcohol use: No   Drug use: Never   Sexual activity: Not on file  Other Topics Concern   Not on file  Social History Narrative   Right handed    Lives with husband and dog   Lives in one story home   Social Determinants of Health   Financial Resource Strain: Not on file  Food Insecurity: Not on file  Transportation Needs: Not on  file  Physical Activity: Not on file  Stress: Not on file  Social Connections: Not on file   Allergies  Allergen Reactions   Codeine Other (See Comments)   Hydrocodone-Ibuprofen Other (See Comments)   Penicillins Other (See Comments)    Unsure    Latex Rash    Medications  (Not in a hospital admission)    Vitals   Vitals:   11/13/21 1530 11/13/21 1600 11/13/21 1700 11/13/21 1830  BP: (!) 153/127 (!) 169/105 133/74 118/64  Pulse: 83 73 72 73  Resp: (!) 26 (!) $Remo'22 17 15  'gQbqz$ Temp:  97.6 F (36.4 C)    TempSrc:      SpO2: 91% 100% 96% 96%  Weight:      Height:         Body mass index is 19.53 kg/m.  Physical Exam   General: Laying comfortably in bed; in no acute distress.  HENT: Normal oropharynx and mucosa. Normal external appearance of ears and nose.  Neck:  Supple, no pain or tenderness  CV: No JVD. No peripheral edema.  Pulmonary: Symmetric Chest rise. Normal respiratory effort.  Abdomen: Soft to touch, non-tender.  Ext: No cyanosis, edema, or deformity  Skin: No rash. Normal palpation of skin.   Musculoskeletal: Normal digits and nails by inspection. No clubbing.   Neurologic Examination  Mental status/Cognition: asleep, opens eyes to voice makes eye contact, does not answer orientation questions. Speech/language: mumbles and groans. Will follow 1 step commands. Does not name objects. Cranial nerves:   CN II Pupils equal and reactive to light, unable to assess for VF deficits    CN III,IV,VI EOM intact, no gaze preference or deviation, no nystagmus    CN V normal sensation in V1, V2, and V3 segments bilaterally   CN VII no asymmetry, no nasolabial fold flattening   CN VIII Turns head towards speech   CN IX & X normal palatal elevation, no uvular deviation   CN XI 5/5 head turn and 5/5 shoulder shrug bilaterall   CN XII midline tongue protrusion   Motor:  Muscle bulk: poor, tone normal. Follows commands in all extremities, gives thumbs up and wiggles toes.  Sensation:  Light touch Regards touch in all extremities.   Pin prick    Temperature    Vibration   Proprioception    Coordination/Complex Motor:   Unable to assess.  Labs   CBC:  Recent Labs  Lab 11/13/21 1247  WBC 9.7  HGB 15.4*  HCT 46.3*  MCV 91.3  PLT 778    Basic Metabolic Panel:  Lab Results  Component Value Date   NA 139 11/13/2021   K 4.0 11/13/2021   CO2 23 11/13/2021   GLUCOSE 119 (H) 11/13/2021   BUN 9 11/13/2021   CREATININE 0.90 11/13/2021   CALCIUM 9.3 11/13/2021   GFRNONAA >60 11/13/2021   GFRAA  12/10/2009    >60        The eGFR has been calculated using the MDRD equation. This calculation has not been validated in all clinical situations. eGFR's persistently <60 mL/min signify possible Chronic Kidney Disease.   Lipid Panel:   Lab Results  Component Value Date   [facility]  12/10/2009    86        Total Cholesterol/HDL:CHD Risk Coronary Heart Disease Risk Table                     Men   Women  1/2 Average Risk   3.4  3.3  Average Risk       5.0   4.4  2 X Average Risk   9.6   7.1  3 X Average Risk  23.4   11.0        Use the calculated Patient Ratio above and the CHD Risk Table to determine the patient's CHD Risk.        ATP III CLASSIFICATION (LDL):  <100     mg/dL   Optimal  100-129  mg/dL   Near or Above                    Optimal  130-159  mg/dL   Borderline  160-189  mg/dL   High  >190     mg/dL   Very High   HgbA1c:  Lab Results  Component Value Date   HGBA1C (H) 12/10/2009    5.9 (NOTE)                                                                       According to the ADA Clinical Practice Recommendations for 2011, when HbA1c is used as a screening test:   >=6.5%   Diagnostic of Diabetes Mellitus           (if abnormal result  is confirmed)  5.7-6.4%   Increased risk of developing Diabetes Mellitus  References:Diagnosis and Classification of Diabetes Mellitus,Diabetes XFGH,8299,37(JIRCV 1):S62-S69 and Standards of Medical Care in         Diabetes - 2011,Diabetes Care,2011,34  (Suppl 1):S11-S61.   Urine Drug Screen: No results found for: "LABOPIA", "COCAINSCRNUR", "LABBENZ", "AMPHETMU", "THCU", "LABBARB"  Alcohol Level     Component Value Date/Time   ETH <10 11/13/2021 1605    CT Head without contrast(Personally reviewed): CTH was negative for a large hypodensity concerning for a large territory infarct or hyperdensity concerning for an ICH.  I do not think she hs ventricular enlargement greater than sulcal enlargement specially compared to the MRI from Aug 2022.  rEEG:  pending  Impression   Analeise Mccleery Zenon is a 76 y.o. female with PMH significant for alzheimers dementia, breast cancer, COPD, HTN, HLD who presents with progressived decline in ability to do ADLs and failure to  thrive. No obvious infectious or metabolic etiology to her presentation.   Suspect that this is likely progression of her dementia. Will evaluate for reversible nutritional causes of dementia/worsening dementia specially with her poor appetite.  Will add memantine for concern for moderate/severe dementia to Donepezil. Would recommend getting routine EEG and call us if there are epileptiform discharges or seizures.  Recommendations  - Will start Memantine $RemoveBeforeD'5mg'xHywzmyplbpvbj$  daily, can be increased every week by $Remov'5mg'YVpxkQ$  upto a max dose of $Remov'20mg'xwVmbF$  daily as tolerated. - Recommend routine EEG. Althou low suspicion for seizure. - B12, TSH, folate, B1, HIV and RPR for further evaluation of reversible causes of dementia. Given her poor appetite, i will start her on multivitamin with multimineral tablet. - We will signoff and only be available on an as needed basis. Please call us with any questions or concerns. Recommend follow up with Dr. Delice Lesch with [facility]. ______________________________________________________________________   Thank you for the opportunity to take part in the care of this patient. If you have  any further questions, please contact the neurology consultation attending.  Signed,  [place] Pager Number [phone] _ _ _   _ __   _ __ _ _  __ __   _ __   __ _

## 2021-11-13 NOTE — Assessment & Plan Note (Addendum)
76 year old female with history of Alzhemiers disease with 3 month decline in physical function then 3-4 week over all decline in her ADLs, baseline mentation and recent hallucinations, fatigue and increased confusion.  -obs to telemetry -CT head with possibility of hydrocephalus, but neurology consulted and reviewed with recent MRI and did not feel like this was the case -troponin mildly elevated but flat/declining in setting of no reported chest pain -continue  Neuro checks, check EEG.  -no fever or signs of meningitis  -no other infectious causes. Normal CXR and UA with no indication of infection. Urine culture pending -check metabolic labs  -? If progression of her alzheimer's disesae, but seems quite rapid mental deterioration over the past few days.  -protecting airway, continue low dose IVF, appears dry on exam with ketones in urine  -swallow screen -PT/OT to eval, if no improvement to baseline will need SNF placement

## 2021-11-13 NOTE — Assessment & Plan Note (Signed)
Per husband only smokes a few cigarettes a day and has not really been interested in last few days Declines nicotine patch at this time

## 2021-11-13 NOTE — Assessment & Plan Note (Addendum)
Progressive decline, seems rapid to justify progressing dementia, but could be playing a part.  Continue aricept '10mg'$  daily and remeron at bedtime Having visual hallucinations: unsure if pain contributing as she screams every time I touch her. Will start scheduled tylenol x 6 doses. Try to avoid antipsychotic drugs, BZD and antihistamines.  Delirium precautions

## 2021-11-13 NOTE — Assessment & Plan Note (Signed)
Continue evista. 

## 2021-11-13 NOTE — Progress Notes (Signed)
EEG complete - results pending 

## 2021-11-13 NOTE — Plan of Care (Signed)
I am asked to comment on radiology report from head CT today.  Head CT reviewed, radiology notes increase in ventricle size and sulcal atrophy compared to head CT from 2014 Compared to MRI brain from November 28, 2020, minimal change Additionally, callosal angle is approximately 120; in  [facility] a callosal angle of 50-80 is typically seen  Based on brief history provided to me by ED provider suspect that patient is having progression of her dementia; noting she has close follow-up in place with her outpatient neurologist in 9 days  Do recommend routine EEG if patient needs to be admitted due to inability to be cared for at home  If needed, neurology is available for full consultation  Lesleigh Noe MD-PhD [facility] [phone]  Available 7 AM to 7 PM, outside these hours please contact Neurologist on call listed on AMION

## 2021-11-13 NOTE — Assessment & Plan Note (Signed)
Continue altace '5mg'$  daily

## 2021-11-13 NOTE — ED Provider Notes (Signed)
[facility] EMERGENCY DEPARTMENT Provider Note   CSN: [id number] Arrival date & time: 11/13/21  1212     History  Chief Complaint  Patient presents with   Altered Mental Status    Denise Olson is a 76 y.o. female with past medical history significant for Alzheimer's, previous breast cancer with treatment with mastectomy and chemotherapy on the left who presents after being brought in by EMS due to altered mental status on baseline dementia.  She slid down out of her chair and was found on her back.  Husband reports that she has been on the floor for the last 24 hours.  She was laying on the back the whole time.  Patient arrives smelling of urine, pleasantly demented.  She was given 450 mL of normal saline in route.  CBG 114, with stable vital signs on arrival.  Patient has some chronic right shoulder pain, is endorsing right shoulder pain on arrival.  No visible deformity noted on initial evaluation   Altered Mental Status      Home Medications Prior to Admission medications   Medication Sig Start Date End Date Taking? Authorizing Provider  aspirin 81 MG chewable tablet Chew by mouth daily.    [provider]  atorvastatin (LIPITOR) 40 MG tablet Take 1 tablet by mouth daily. 05/21/20   [provider]  Calcium Carbonate-Vit D-Min (CALCIUM 1200 PO) Take by mouth.    [provider]  cholecalciferol (VITAMIN D3) 25 MCG (1000 UNIT) tablet Take 1,000 Units by mouth daily.    [provider]  donepezil (ARICEPT) 10 MG tablet Take one tablet daily 05/20/21   Rondel Jumbo, PA-C  mirtazapine (REMERON) 7.5 MG tablet Take 7.5 mg by mouth at bedtime. 05/18/21   [provider]  raloxifene (EVISTA) 60 MG tablet Take 60 mg by mouth daily. 03/25/20   [provider]  ramipril (ALTACE) 5 MG capsule Take 5 mg by mouth daily. 05/29/20   [provider]      Allergies    Codeine, Hydrocodone-ibuprofen, and Penicillins     Review of Systems   Review of Systems  Reason unable to perform ROS: Level 5 Caveat: AMS.    Physical Exam Updated Vital Signs BP (!) 131/94 (BP Location: Right Arm)   Pulse 88   Temp (!) 97.3 F (36.3 C) (Oral)   Resp 16   SpO2 100%  Physical Exam Vitals and nursing note reviewed.  Constitutional:      General: She is not in acute distress.    Comments: Patient disheveled, foul-smelling, urine soaked clothes  HENT:     Head: Normocephalic and atraumatic.  Eyes:     General:        Right eye: No discharge.        Left eye: No discharge.     Comments: Pupils are 2 to 3 mm bilaterally, reactive to light, EOMs intact  Cardiovascular:     Rate and Rhythm: Normal rate and regular rhythm.     Heart sounds: No murmur heard.    No friction rub. No gallop.  Pulmonary:     Effort: Pulmonary effort is normal.     Breath sounds: Normal breath sounds.  Abdominal:     General: Bowel sounds are normal.     Palpations: Abdomen is soft.  Skin:    General: Skin is warm and dry.     Capillary Refill: Capillary refill takes less than 2 seconds.  Neurological:  Mental Status: She is alert.     Comments: Patient is confused, oriented to self but not time or place.  She can move all limbs spontaneously, denies any sensory deficit.  Psychiatric:        Mood and Affect: Mood normal.        Behavior: Behavior normal.     ED Results / Procedures / Treatments   Labs (all labs ordered are listed, but only abnormal results are displayed) Labs Reviewed  URINE CULTURE  CBC  COMPREHENSIVE METABOLIC PANEL  URINALYSIS, ROUTINE W REFLEX MICROSCOPIC  CK    EKG None  Radiology No results found.  Procedures Procedures    Medications Ordered in ED Medications  sodium chloride 0.9 % bolus 1,000 mL (has no administration in time range)    ED Course/ Medical Decision Making/ A&P Clinical Course as of 11/13/21 1527  Sat Nov 13, 2021  1521 Leukocytes,Ua(!): TRACE [JS]  1521  Bacteria, UA(!): RARE [JS]    Clinical Course User Index [JS] Janeece Fitting, PA-C                           Medical Decision Making Amount and/or Complexity of Data Reviewed Labs: ordered. Radiology: ordered.   This patient is a 76 y.o. female who presents to the ED for concern of Fall, laying on the ground for 24 hours, altered mental status from demented baseline, this involves an extensive number of treatment options, and is a complaint that carries with it a high risk of complications and morbidity. The emergent differential diagnosis prior to evaluation includes, but is not limited to, acute intracranial injury, other musculoskeletal injury, dehydration, rhabdomyolysis, medication overuse, stroke, seizure, infection, sepsis versus other.   This is not an exhaustive differential.   Past Medical History / Co-morbidities / Social History: History of Alzheimer's, previous breast cancer with chemotherapy, left-sided mastectomy  Additional history: Chart reviewed. Pertinent results include: Reviewed outpatient neurology notes, notably patient with normal-appearing MRI from 1 year ago, previous CT showed some degenerative changes, no recent CT on file.  Physical Exam: Physical exam performed. The pertinent findings include: Patient with some tenderness of the right upper extremity, she has a chronic right arm fracture.  She moves all limbs spontaneously, she is pleasantly demented, difficulty following directions for full neurologic exam, she denies any sensory deficits, she is alert and oriented to herself.  She is more confused than baseline per her husband who is at bedside.  Lab Tests: I ordered, and personally interpreted labs.  The pertinent results include: UA with some ketones and protein suggestive of mild dehydration but no evidence of acute urinary tract infection.  CBC with mild erythrocytosis, likely secondary to hemoconcentration.  No clinically significant leukocytosis.  Initial  troponin mildly elevated at 57, no signs of acute ischemia or chest pain on initial eval.   Imaging Studies: I ordered imaging studies including plain film chest x-ray, pelvis, shoulder with no acute fractures, dislocation, chronic shoulder fracture noted.  CT head without contrast . I independently visualized and interpreted imaging which showed questionable hydrocephalus, increased ventricular volume greater than sulci volume. I agree with the radiologist interpretation.   Cardiac Monitoring:  The patient was maintained on a cardiac monitor.  My attending physician Dr. Doren Custard viewed and interpreted the cardiac monitored which showed an underlying rhythm of: NSR. I agree with this interpretation.   Medications: I ordered medication including fluid bolus for clinical dehydration.  Patient will require reevaluation  during admission.  Consultations Obtained: I requested consultation with the neurologist, neurosurgeon,  and discussed lab and imaging findings as well as pertinent plan - they recommend: Dr. Curly Shores not given patient's symptoms are explained by hydrocephalus, when comparing her CT with more recent MRI from less than a year ago she does not see acute angle change at collosum, and reports less significant increase in ventricular volume compared to previous.  She is suspicious of progression from baseline dementia and Alzheimer's.  Patient however with weakness, progressive plan of symptoms  Spoke with Dr. Eliberto Ivory who agrees to admission and will discuss with neurology about potential EEG while hospitalized vs. Other workup.   I discussed this case with my attending physician who cosigned this note including patient's presenting symptoms, physical exam, and planned diagnostics and interventions. Attending physician stated agreement with plan or made changes to plan which were implemented.   Attending physician assessed patient at bedside.   Final Clinical Impression(s) / ED Diagnoses Final  diagnoses:  None    Rx / DC Orders ED Discharge Orders     None         Dorien Chihuahua 11/13/21 1549    Godfrey Pick, MD 11/14/21 1202

## 2021-11-13 NOTE — Assessment & Plan Note (Addendum)
Troponin mildly bumped and flat. She denies any chest pain, no findings on EKG Not consistent with ACS  ? Atypical LBBB. Unsure if new as no prior tracing to compare this to  Continue telemetry

## 2021-11-13 NOTE — Progress Notes (Signed)
Patient was eating. Went to place EEG on another pt.

## 2021-11-13 NOTE — H&P (Addendum)
History and Physical    Patient: Denise Olson YSA:[id number] DOB: Jun 25, 1945 DOA: 11/13/2021 DOS: the patient was seen and examined on 11/13/2021 PCP: [name], MD  Patient coming from: Home - lives with her husband. Has been ambulating independently at home.    Chief Complaint: AMS/FTT  HPI: Denise Olson is a 76 y.o. female with medical history significant of breast cancer, Alzheimer's, COPD, HTN, HLD, nicotine dependence who presented to ED with AMS. She has had progressive decline over the past 3-4 weeks including incontinence, inability to ambulate on own, balance issues, fatigue and sleeping in chair. This was Thursday night. She somehow moved from the chair to the floor and she slept on the floor all night. When he tried to move her she screamed at him and wouldn't move. She was on the ground all day yesterday and he thought she just needed to sleep. This morning she was still on the floor. Every time he touched her she would yell. So he called 911. He denies any shaking/seizure like activity, no focal deficits. She has had some visual hallucinations that started Thursday night that is new for her. This morning she claimed a woman ran through the room and while Im in the room she thought a cat kept running outside the door. Denies any new medication or stressful life event.   He states she has had a physical decline over the past 3 months. Starting in march of 2022 she couldn't remember how to open the back door. She started to see neurology at that time. She had no memory, but could  hold conversations. Gradually she was having dreams and thinking it was reality. She typically can carry on conversation that makes sense, but she will not remember. She needs help with all of her ADLs. She has urinary incontinence only during sleeping. She does not need help eating, but has all of her meals prepared for her.   She does smoke 6 cigarettes/day. No alcohol use.   No fever/chills. No  recent illness. No N/V/D. No leg swelling.   ER Course:  vitals: temp: 97.3, bp: 131/94, HR: 88, RR: 16, oxygen: 100%RA Pertinent labs: troponin 57>52, UA 20 ketones.  CT  head: no acute finding. Ventricular enlargement greater than the sulcal enlargement. Pattern of ventricular enlargement raises possibility of communicating hydrocephalus. New compared to CT in 2014.  CXR: cardiomegaly, no acute finding Right shoulder: no acute finding.  In ED 1L bolus given. PT ordered. Neurology and NS consulted. TRH asked to admit    Review of Systems: Unable to review all systems due to lack of cooperation from patient. Past Medical History:  Diagnosis Date   Breast cancer ([place])    Cancer ([place])    Personal history of chemotherapy    Past Surgical History:  Procedure Laterality Date   HYSTEROTOMY     MASTECTOMY Left    over 20 years ago   Social History:  reports that she has been smoking cigarettes. She has been smoking an average of .5 packs per day. She has never used smokeless tobacco. She reports that she does not drink alcohol and does not use drugs.  Allergies  Allergen Reactions   Codeine Other (See Comments)   Hydrocodone-Ibuprofen Other (See Comments)   Penicillins Other (See Comments)    Unsure    Latex Rash    Family History  Problem Relation Age of Onset   Breast cancer Neg Hx     Prior to Admission medications  Medication Sig Start Date End Date Taking? Authorizing Provider  aspirin 81 MG chewable tablet Chew by mouth daily.    [provider]  atorvastatin (LIPITOR) 40 MG tablet Take 1 tablet by mouth daily. 05/21/20   [provider]  Calcium Carbonate-Vit D-Min (CALCIUM 1200 PO) Take by mouth.    [provider]  cholecalciferol (VITAMIN D3) 25 MCG (1000 UNIT) tablet Take 1,000 Units by mouth daily.    [provider]  donepezil (ARICEPT) 10 MG tablet Take one tablet daily 05/20/21   Rondel Jumbo, PA-C  mirtazapine (REMERON) 7.5  MG tablet Take 7.5 mg by mouth at bedtime. 05/18/21   [provider]  raloxifene (EVISTA) 60 MG tablet Take 60 mg by mouth daily. 03/25/20   [provider]  ramipril (ALTACE) 5 MG capsule Take 5 mg by mouth daily. 05/29/20   [provider]    Physical Exam: Vitals:   11/13/21 1500 11/13/21 1515 11/13/21 1530 11/13/21 1600  BP: 128/82 (!) 141/68 (!) 153/127 (!) 169/105  Pulse: 74 (!) 117 83 73  Resp: (!) 21 (!) 21 (!) 26 (!) 22  Temp:    97.6 F (36.4 C)  TempSrc:      SpO2: 100% (!) 67% 91% 100%  Weight:      Height:       General:  Appears calm and comfortable and is in NAD. Pleasantly confused.  Eyes:  PERRL, EOMI, normal lids, iris ENT:  grossly normal hearing, lips & tongue, mmm; appropriate dentition Neck:  no LAD, masses or thyromegaly; no carotid bruits Cardiovascular:  RRR, no m/r/g. No LE edema.  Respiratory:   CTA bilaterally with no wheezes/rales/rhonchi.  Normal respiratory effort. Abdomen:  soft, NT, ND, NABS Back:   normal alignment, no CVAT Skin:  no rash or induration seen on limited exam. Skin tenting. No obvious skin tears or breakdown, but limited exam.  Musculoskeletal:  grossly normal tone BUE/BLE, good ROM, no bony abnormality. Screams when I touch any part of her.  Lower extremity:  No LE edema.  Limited foot exam with no ulcerations.  2+ distal pulses. Psychiatric:  grossly normal mood and affect, speech fluent and does not make sense, oriented to person only. +visual hallucinations  Neurologic:  limited exam. Will not follow directions. No obvious focal deficits.  moves all extremities in coordinated fashion, sensation intact   Radiological Exams on Admission: Independently reviewed - see discussion in A/P where applicable  CT Head Wo Contrast  Result Date: 11/13/2021 CLINICAL DATA:  Patient slid out of her chair. Found on her back. Altered mental status. EXAM: CT HEAD WITHOUT CONTRAST TECHNIQUE: Contiguous axial images were  obtained from the base of the skull through the vertex without intravenous contrast. RADIATION DOSE REDUCTION: This exam was performed according to the departmental dose-optimization program which includes automated exposure control, adjustment of the mA and/or kV according to patient size and/or use of iterative reconstruction technique. COMPARISON:  01/28/2013. FINDINGS: Brain: No evidence of acute infarction, hemorrhage, hydrocephalus, extra-axial collection or mass lesion/mass effect. There is ventricular greater than sulcal enlargement consistent with mild diffuse atrophy, which has significantly increased when compared to the prior head CT. Patchy areas of periventricular white matter hypoattenuation are also noted consistent with mild chronic microvascular ischemic change. Vascular: No hyperdense vessel or unexpected calcification. Skull: Normal. Negative for fracture or focal lesion. Sinuses/Orbits: Globes and orbits are unremarkable. The visualized sinuses are clear. Other: None. IMPRESSION: 1. No definite acute intracranial abnormality. 2. There  is ventricular enlargement greater than sulcal enlargement. Pattern of ventricular enlargement raises the possibility of communicating hydrocephalus. Both the ventricular and sulcal enlargement is new compared to the CT from 01/28/2013. Electronically Signed   By: Lajean Manes M.D.   On: 11/13/2021 14:21   DG Chest Portable 1 View  Result Date: 11/13/2021 CLINICAL DATA:  Fall, pain EXAM: PORTABLE CHEST 1 VIEW COMPARISON:  08/18/2011 FINDINGS: Cardiomegaly. Both lungs are clear. Chronic fracture deformity of the proximal right humerus. 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 IMPRESSION: Cardiomegaly. No acute abnormality of the lungs in AP portable projection. Electronically Signed   By: Delanna Ahmadi M.D.   On: 11/13/2021 13:48   DG Pelvis Portable  Result Date: 11/13/2021 CLINICAL DATA:  Trauma, fall, pain EXAM: PORTABLE PELVIS 1-2 VIEWS  COMPARISON:  None Available. FINDINGS: No displaced fracture or dislocation is seen. Degenerative changes are noted in lower lumbar spine and both SI joints. IMPRESSION: No recent fracture or dislocation is seen in pelvis. Electronically Signed   By: Elmer Picker M.D.   On: 11/13/2021 13:48   DG Shoulder Right  Result Date: 11/13/2021 CLINICAL DATA:  Fall, pain EXAM: RIGHT SHOULDER - 2+ VIEW COMPARISON:  03/02/2015 FINDINGS: There is no evidence of acute fracture or dislocation. Chronic fracture deformity of the proximal right humerus, seen acutely on examination dated 03/02/2015. Mild acromioclavicular and glenohumeral arthrosis. Soft tissues are unremarkable. IMPRESSION: 1.  No acute fracture or dislocation of the right shoulder. 2. Chronic fracture deformity of the proximal right humerus, seen acutely on examination dated 03/02/2015. Electronically Signed   By: Delanna Ahmadi M.D.   On: 11/13/2021 13:47    EKG: Independently reviewed.  NSR with rate 74, IVCD possible atypical LBBB; nonspecific ST changes with no evidence of acute ischemia No previous ekg to compare this to   Labs on Admission: I have personally reviewed the available labs and imaging studies at the time of the admission.  Pertinent labs:   troponin 57>52,  UA 20 ketones.  Assessment and Plan: Principal Problem:   Acute encephalopathy Active Problems:   Alzheimer's disease ([place])   Elevated troponin   Essential hypertension   Mixed hyperlipidemia   History of breast cancer   Nicotine dependence    Assessment and Plan: * Acute encephalopathy 76 year old female with history of Alzhemiers disease with 3 month decline in physical function then 3-4 week over all decline in her ADLs, baseline mentation and recent hallucinations, fatigue and increased confusion.  -obs to telemetry -CT head with possibility of hydrocephalus, but neurology consulted and reviewed with recent MRI and did not feel like this was the  case -troponin mildly elevated but flat/declining in setting of no reported chest pain -continue  Neuro checks, check EEG.  -no fever or signs of meningitis  -no other infectious causes. Normal CXR and UA with no indication of infection. Urine culture pending -check metabolic labs  -? If progression of her alzheimer's disesae, but seems quite rapid mental deterioration over the past few days.  -protecting airway, continue low dose IVF, appears dry on exam with ketones in urine  -swallow screen -PT/OT to eval, if no improvement to baseline will need SNF placement    Alzheimer's disease ([place]) Progressive decline, seems rapid to justify progressing dementia, but could be playing a part.  Continue aricept '10mg'$  daily and remeron at bedtime Having visual hallucinations: unsure if pain contributing as she screams every time I touch her. Will start scheduled tylenol x 6  doses. Try to avoid antipsychotic drugs, BZD and antihistamines.  Delirium precautions   Elevated troponin Troponin mildly bumped and flat. She denies any chest pain, no findings on EKG Not consistent with ACS  ? Atypical LBBB. Unsure if new as no prior tracing to compare this to  Continue telemetry  Essential hypertension Continue altace '5mg'$  daily   Mixed hyperlipidemia Continue lipitor daily   History of breast cancer Continue evista   Nicotine dependence Per husband only smokes a few cigarettes a day and has not really been interested in last few days Declines nicotine patch at this time     Advance Care Planning:   Code Status: Full Code   Consults: neurology, PT/OT   DVT Prophylaxis: lovenox   Family Communication: updated husband by phone: [name] Oppenheimer   Severity of Illness: The appropriate patient status for this patient is OBSERVATION. Observation status is judged to be reasonable and necessary in order to provide the required intensity of service to ensure the patient's safety. The patient's presenting  symptoms, physical exam findings, and initial radiographic and laboratory data in the context of their medical condition is felt to place them at decreased risk for further clinical deterioration. Furthermore, it is anticipated that the patient will be medically stable for discharge from the hospital within 2 midnights of admission.   Author: Orma Flaming, MD 11/13/2021 5:30 PM  For on call review [email or web address].

## 2021-11-14 DIAGNOSIS — G9340 Encephalopathy, unspecified: Secondary | ICD-10-CM | POA: Diagnosis not present

## 2021-11-14 DIAGNOSIS — R4182 Altered mental status, unspecified: Secondary | ICD-10-CM | POA: Diagnosis not present

## 2021-11-14 LAB — CBC
HCT: 37.8 % (ref 36.0–46.0)
Hemoglobin: 12.5 g/dL (ref 12.0–15.0)
MCH: 30.3 pg (ref 26.0–34.0)
MCHC: 33.1 g/dL (ref 30.0–36.0)
MCV: 91.5 fL (ref 80.0–100.0)
Platelets: 323 10*3/uL (ref 150–400)
RBC: 4.13 MIL/uL (ref 3.87–5.11)
RDW: 13.9 % (ref 11.5–15.5)
WBC: 9.3 10*3/uL (ref 4.0–10.5)
nRBC: 0.2 % (ref 0.0–0.2)

## 2021-11-14 LAB — BASIC METABOLIC PANEL
Anion gap: 7 (ref 5–15)
BUN: 12 mg/dL (ref 8–23)
CO2: 23 mmol/L (ref 22–32)
Calcium: 8.4 mg/dL — ABNORMAL LOW (ref 8.9–10.3)
Chloride: 108 mmol/L (ref 98–111)
Creatinine, Ser: 0.91 mg/dL (ref 0.44–1.00)
GFR, Estimated: >60 mL/min (ref >60)
Glucose, Bld: 100 mg/dL — ABNORMAL HIGH (ref 70–99)
Potassium: 3.6 mmol/L (ref 3.5–5.1)
Sodium: 138 mmol/L (ref 135–145)

## 2021-11-14 LAB — FOLATE: Folate: 3.5 ng/mL — ABNORMAL LOW (ref >5.9)

## 2021-11-14 LAB — RESP PANEL BY RT-PCR (FLU A&B, COVID) ARPGX2
Influenza A by PCR: NEGATIVE
Influenza B by PCR: NEGATIVE
SARS Coronavirus 2 by RT PCR: NEGATIVE

## 2021-11-14 LAB — MAGNESIUM: Magnesium: 1.8 mg/dL (ref 1.7–2.4)

## 2021-11-14 LAB — RPR: RPR Ser Ql: NONREACTIVE

## 2021-11-14 MED ORDER — FOLIC ACID 1 MG PO TABS
1.0000 mg | ORAL_TABLET | Freq: Every day | ORAL | Status: DC
  Administered 2021-11-14 – 2021-11-18 (×5): 1 mg via ORAL
  Filled 2021-11-14 (×5): qty 1

## 2021-11-14 MED ORDER — ADULT MULTIVITAMIN W/MINERALS CH
1.0000 | ORAL_TABLET | Freq: Every day | ORAL | Status: DC
  Administered 2021-11-14 – 2021-11-18 (×5): 1 via ORAL
  Filled 2021-11-14 (×5): qty 1

## 2021-11-14 MED ORDER — MEMANTINE HCL 10 MG PO TABS
5.0000 mg | ORAL_TABLET | Freq: Every day | ORAL | Status: DC
  Administered 2021-11-15 – 2021-11-18 (×4): 5 mg via ORAL
  Filled 2021-11-14 (×4): qty 1

## 2021-11-14 MED ORDER — CYANOCOBALAMIN 1000 MCG/ML IJ SOLN
1000.0000 ug | Freq: Every day | INTRAMUSCULAR | Status: AC
  Administered 2021-11-14 – 2021-11-16 (×3): 1000 ug via SUBCUTANEOUS
  Filled 2021-11-14 (×3): qty 1

## 2021-11-14 MED ORDER — VITAMIN B-12 1000 MCG PO TABS
1000.0000 ug | ORAL_TABLET | Freq: Every day | ORAL | Status: DC
  Administered 2021-11-17 – 2021-11-18 (×2): 1000 ug via ORAL
  Filled 2021-11-14 (×2): qty 1

## 2021-11-14 MED ORDER — ENOXAPARIN SODIUM 40 MG/0.4ML IJ SOSY
40.0000 mg | PREFILLED_SYRINGE | INTRAMUSCULAR | Status: DC
  Administered 2021-11-15 – 2021-11-17 (×3): 40 mg via SUBCUTANEOUS
  Filled 2021-11-14 (×4): qty 0.4

## 2021-11-14 NOTE — ED Notes (Signed)
OT at bedside. 

## 2021-11-14 NOTE — TOC Initial Note (Signed)
Transition of Care [facility]) - Initial/Assessment Note    Patient Details  Name: Denise Olson MRN: [id number] Date of Birth: Sep 08, 1945  [facility] CM/SW Contact:    Verdell Carmine, RN Phone Number: 11/14/2021, 12:27 PM  Clinical Narrative:                 Patient presented with AMS encephalopathy. Has history of alzheimer PT assessing patient. Does have husband listed Called him to discuss patient, however could not leave a message and no cell phone. Will likely need HH or SNF placement.   TOC will follow for needs, recommendations, and transitions of care.   Expected Discharge Plan: [place] Barriers to Discharge: Continued Medical Work up   Patient Goals and CMS Choice        Expected Discharge Plan and Services Expected Discharge Plan: [place] arrangements for the past 2 months: Single Family Home                                      Prior Living Arrangements/Services Living arrangements for the past 2 months: Single Family Home Lives with:: Spouse Patient language and need for interpreter reviewed:: Yes        Need for Family Participation in Patient Care: Yes (Comment) Care giver support system in place?: Yes (comment)   Criminal Activity/Legal Involvement Pertinent to Current Situation/Hospitalization: No - Comment as needed  Activities of Daily Living      Permission Sought/Granted                  Emotional Assessment       Orientation: : Fluctuating Orientation (Suspected and/or reported Sundowners) Alcohol / Substance Use: Not Applicable Psych Involvement: No (comment)  Admission diagnosis:  Acute encephalopathy [G93.40] Patient Active Problem List   Diagnosis Date Noted   Essential hypertension 11/13/2021   Mixed hyperlipidemia 11/13/2021   Nicotine dependence 11/13/2021   Acute encephalopathy 11/13/2021   History of breast cancer 11/13/2021   Elevated troponin  11/13/2021   Alzheimer's disease ([place]) 05/20/2021   Pain due to onychomycosis of toenails of both feet 08/17/2020   PCP:  [name], MD Pharmacy:   [facility] [address], [place] AT [place] [address] [place] [address] Phone: [phone] Fax: [phone]     Social Determinants of Health (SDOH) Interventions    Readmission Risk Interventions     No data to display

## 2021-11-14 NOTE — Evaluation (Signed)
Occupational Therapy Evaluation Patient Details Name: Denise Olson MRN: [id number] DOB: Aug 29, 1945 Today's Date: 11/14/2021   History of Present Illness 76 y.o. F admitted on 11/13/21 due to increased confusion, incontinence, and difficulty ambulating. PMH significant for breast cancer, Alzheimer's, COPD, HTN, HLD, nicotine dependence.   Clinical Impression   Pt admitted for concerns listed above. Unable to obtain prior level of function due to patient cognition and unable to get in touch with family. At this time, pt presents with mild weakness and difficulty following simple commands/attending to tasks. Pt is a fall risk as well due to balance deficits. She is requiring min guard to mod A for ADL's, and overall min A for functional mobility with a RW. Recommending [facility], as long as family is able to continue providing 24/7 assist. OT will follow acutely.       Recommendations for follow up therapy are one component of a multi-disciplinary discharge planning process, led by the attending physician.  Recommendations may be updated based on patient status, additional functional criteria and insurance authorization.   Follow Up Recommendations  Home health OT    Assistance Recommended at Discharge Frequent or constant Supervision/Assistance  Patient can return home with the following A little help with walking and/or transfers;A lot of help with bathing/dressing/bathroom;Assistance with cooking/housework;Direct supervision/assist for medications management;Direct supervision/assist for financial management;Assist for transportation;Help with stairs or ramp for entrance    Functional Status Assessment  Patient has had a recent decline in their functional status and demonstrates the ability to make significant improvements in function in a reasonable and predictable amount of time.  Equipment Recommendations  Other (comment) (TBD)    Recommendations for Other Services       Precautions /  Restrictions Precautions Precautions: None Restrictions Weight Bearing Restrictions: No      Mobility Bed Mobility Overal bed mobility: Needs Assistance Bed Mobility: Supine to Sit, Sit to Supine     Supine to sit: Min assist, HOB elevated Sit to supine: Min guard   General bed mobility comments: Min A to pull to sitting, and min guard for safety and sequencing as she returned to supine    Transfers Overall transfer level: Needs assistance Equipment used: Rolling walker (2 wheels) Transfers: Sit to/from Stand Sit to Stand: Min assist           General transfer comment: Min A from elevated ED stretcher surface, needed assist to steady initially.      Balance Overall balance assessment: Needs assistance Sitting-balance support: No upper extremity supported, Feet unsupported Sitting balance-Leahy Scale: Fair     Standing balance support: Bilateral upper extremity supported Standing balance-Leahy Scale: Fair Standing balance comment: Pt able to let go of walker and maintain static balance, needs increased assist for dynamic balance unsupported                           ADL either performed or assessed with clinical judgement   ADL Overall ADL's : Needs assistance/impaired Eating/Feeding: Minimal assistance;Sitting   Grooming: Minimal assistance;Sitting   Upper Body Bathing: Minimal assistance;Sitting   Lower Body Bathing: Moderate assistance;Sitting/lateral leans;Sit to/from stand   Upper Body Dressing : Minimal assistance;Sitting   Lower Body Dressing: Moderate assistance;Sitting/lateral leans;Sit to/from stand   Toilet Transfer: Minimal assistance;Ambulation   Toileting- Clothing Manipulation and Hygiene: Minimal assistance;Sitting/lateral lean;Sit to/from stand       Functional mobility during ADLs: Minimal assistance;Rolling walker (2 wheels) General ADL Comments: Pt requiring mod-max multimodal  cuing for all ADL's and mobility     Vision  Baseline Vision/History: 0 No visual deficits Ability to See in Adequate Light: 0 Adequate Patient Visual Report: No change from baseline Vision Assessment?: No apparent visual deficits     Perception     Praxis      Pertinent Vitals/Pain Pain Assessment Pain Assessment: Faces Faces Pain Scale: Hurts little more Pain Location: R shoulder Pain Descriptors / Indicators: Aching, Discomfort, Grimacing Pain Intervention(s): Monitored during session, Repositioned     Hand Dominance Right   Extremity/Trunk Assessment Upper Extremity Assessment Upper Extremity Assessment: Generalized weakness   Lower Extremity Assessment Lower Extremity Assessment: Defer to PT evaluation   Cervical / Trunk Assessment Cervical / Trunk Assessment: Kyphotic   Communication Communication Communication: No difficulties   Cognition Arousal/Alertness: Awake/alert Behavior During Therapy: Impulsive Overall Cognitive Status: History of cognitive impairments - at baseline                                 General Comments: Pt with hx or Alzhiemer's     General Comments  VSS on RA    Exercises     Shoulder Instructions      Home Living Family/patient expects to be discharged to:: Unsure                                 Additional Comments: Pt unable to state and husband not answering phone, unable to leave message.      Prior Functioning/Environment Prior Level of Function : Needs assist             Mobility Comments: Pt requiring supervision assist for mobility ADLs Comments: Pt requiring cuing and sequencing assist        OT Problem List: Decreased strength;Decreased activity tolerance;Impaired balance (sitting and/or standing);Decreased cognition;Decreased safety awareness      OT Treatment/Interventions: Self-care/ADL training;Therapeutic exercise;Energy conservation;DME and/or AE instruction;Therapeutic activities;Cognitive  remediation/compensation;Patient/family education;Balance training    OT Goals(Current goals can be found in the care plan section) Acute Rehab OT Goals Patient Stated Goal: None stated OT Goal Formulation: Patient unable to participate in goal setting Time For Goal Achievement: 11/28/21 Potential to Achieve Goals: Good ADL Goals Pt Will Perform Lower Body Bathing: with supervision;sitting/lateral leans;sit to/from stand Pt Will Perform Lower Body Dressing: with supervision;sitting/lateral leans;sit to/from stand Pt Will Transfer to Toilet: with supervision;ambulating Pt Will Perform Toileting - Clothing Manipulation and hygiene: with supervision;sitting/lateral leans;sit to/from stand Additional ADL Goal #1: Pt will follow 1 step commands 100% of session, with min verbal cues.  OT Frequency: Min 2X/week    Co-evaluation PT/OT/SLP Co-Evaluation/Treatment: Yes Reason for Co-Treatment: To address functional/ADL transfers;Necessary to address cognition/behavior during functional activity   OT goals addressed during session: Proper use of Adaptive equipment and DME;ADL's and self-care      AM-PAC OT "6 Clicks" Daily Activity     Outcome Measure Help from another person eating meals?: A Little Help from another person taking care of personal grooming?: A Little Help from another person toileting, which includes using toliet, bedpan, or urinal?: A Little Help from another person bathing (including washing, rinsing, drying)?: A Lot Help from another person to put on and taking off regular upper body clothing?: A Little Help from another person to put on and taking off regular lower body clothing?: A Lot 6 Click Score: 16   End  of Session Equipment Utilized During Treatment: Gait belt;Rolling walker (2 wheels) Nurse Communication: Mobility status  Activity Tolerance: Patient tolerated treatment well Patient left: in bed  OT Visit Diagnosis: Unsteadiness on feet (R26.81);Other  abnormalities of gait and mobility (R26.89);Muscle weakness (generalized) (M62.81)                Time: 4462-8638 OT Time Calculation (min): 25 min Charges:  OT General Charges $OT Visit: 1 Visit OT Evaluation $OT Eval Moderate Complexity: 1 Mod  [place] H., OTR/L Acute Rehabilitation  Linzy Darling Elane Yolanda Bonine 11/14/2021, 3:44 PM

## 2021-11-14 NOTE — Evaluation (Signed)
Physical Therapy Evaluation Patient Details Name: Denise Olson MRN: [id number] DOB: 09-Mar-1946 Today's Date: 11/14/2021  History of Present Illness  76 y.o. F admitted on 11/13/21 due to increased confusion, incontinence, and difficulty ambulating. PMH significant for breast cancer, Alzheimer's, COPD, HTN, HLD, nicotine dependence.   Clinical Impression  Pt presents with condition above and deficits mentioned below, see PT Problem List. Pt has hx of Alzheimer's and is unreliable historian and no family present to confirm PLOF/home set-up. Pt likely was mobile at a supervision level PTA though and appears to live with her husband, per chart review. Currently, pt is demonstrating deficits in cognition, balance, activity tolerance, and gross strength that place her at high risk for falls. She is requiring minA using a RW for mobility and cues to direct her and keep her on task continuously. Considering pts with Alzheimer's tend to thrive better in their familiar environments, recommending HHPT at d/c provided the family can adequately provide the level of care the pt needs. If not, she may benefit from going to get rehab at a SNF or potentially going to a long-term  memory care facility. Will continue to follow acutely.     Recommendations for follow up therapy are one component of a multi-disciplinary discharge planning process, led by the attending physician.  Recommendations may be updated based on patient status, additional functional criteria and insurance authorization.  Follow Up Recommendations Home health PT (if family can provide level of care needed, otherwise may benefit from SNF or memory care facility)      Assistance Recommended at Discharge Frequent or constant Supervision/Assistance  Patient can return home with the following  A little help with walking and/or transfers;A little help with bathing/dressing/bathroom;Assistance with cooking/housework;Direct supervision/assist for  medications management;Direct supervision/assist for financial management;Assist for transportation;Help with stairs or ramp for entrance    Equipment Recommendations Rolling walker (2 wheels);BSC/3in1 (unsure of equipment at home)  Recommendations for Other Services       Functional Status Assessment Patient has had a recent decline in their functional status and demonstrates the ability to make significant improvements in function in a reasonable and predictable amount of time.     Precautions / Restrictions Precautions Precautions: Fall Restrictions Weight Bearing Restrictions: No      Mobility  Bed Mobility Overal bed mobility: Needs Assistance Bed Mobility: Supine to Sit, Sit to Supine     Supine to sit: Min assist, HOB elevated Sit to supine: Min guard   General bed mobility comments: Min A to pull to sitting, and min guard for safety and sequencing as she returned to supine    Transfers Overall transfer level: Needs assistance Equipment used: Rolling walker (2 wheels) Transfers: Sit to/from Stand Sit to Stand: Min assist           General transfer comment: Min A from elevated ED stretcher surface, needed assist to steady initially.    Ambulation/Gait Ambulation/Gait assistance: Min assist, +2 safety/equipment Gait Distance (Feet): 200 Feet Assistive device: Rolling walker (2 wheels) Gait Pattern/deviations: Step-through pattern, Decreased stride length, Narrow base of support, Drifts right/left Gait velocity: reduced Gait velocity interpretation: <1.8 ft/sec, indicate of risk for recurrent falls   General Gait Details: Pt with slow, unsteady gait, but intermittently impulsively increasing speeds. Pt needing cues and assistance to avoid obstacles as she would often drift L and R. MinA for stability as well due to trunk sway. Narrow BOS  Stairs            Wheelchair  Mobility    Modified Rankin (Stroke Patients Only)       Balance Overall balance  assessment: Needs assistance Sitting-balance support: No upper extremity supported, Feet unsupported Sitting balance-Leahy Scale: Fair     Standing balance support: Bilateral upper extremity supported, No upper extremity supported, During functional activity Standing balance-Leahy Scale: Fair Standing balance comment: Pt able to let go of walker and maintain static balance, needs increased assist for dynamic balance unsupported, trunk sway noted throughout                             Pertinent Vitals/Pain Pain Assessment Pain Assessment: Faces Faces Pain Scale: Hurts little more Pain Location: R shoulder Pain Descriptors / Indicators: Aching, Discomfort, Grimacing Pain Intervention(s): Limited activity within patient's tolerance, Monitored during session, Repositioned    Home Living Family/patient expects to be discharged to:: Unsure                   Additional Comments: Pt unable to state and husband not answering phone, unable to leave message (per OT attempt)    Prior Function Prior Level of Function : Needs assist             Mobility Comments: Pt likely requiring supervision assist for mobility ADLs Comments: Pt requiring cuing and sequencing assist     Hand Dominance   Dominant Hand: Right    Extremity/Trunk Assessment   Upper Extremity Assessment Upper Extremity Assessment: Defer to OT evaluation    Lower Extremity Assessment Lower Extremity Assessment: Generalized weakness    Cervical / Trunk Assessment Cervical / Trunk Assessment: Kyphotic  Communication   Communication: No difficulties  Cognition Arousal/Alertness: Awake/alert Behavior During Therapy: Impulsive Overall Cognitive Status: History of cognitive impairments - at baseline                                 General Comments: Pt with hx or Alzhiemer's, easily distracted and needs redirecting. Pt with poor problem-solving or awareness to avoid obstales with  RW        General Comments General comments (skin integrity, edema, etc.): VSS on RA    Exercises     Assessment/Plan    PT Assessment Patient needs continued PT services  PT Problem List Decreased strength;Decreased activity tolerance;Decreased balance;Decreased mobility;Decreased cognition;Decreased knowledge of use of DME;Decreased safety awareness       PT Treatment Interventions DME instruction;Gait training;Stair training;Functional mobility training;Therapeutic activities;Therapeutic exercise;Balance training;Neuromuscular re-education;Cognitive remediation;Patient/family education    PT Goals (Current goals can be found in the Care Plan section)  Acute Rehab PT Goals Patient Stated Goal: to get warm PT Goal Formulation: With patient Time For Goal Achievement: 11/28/21 Potential to Achieve Goals: Fair    Frequency Min 3X/week     Co-evaluation PT/OT/SLP Co-Evaluation/Treatment: Yes Reason for Co-Treatment: Necessary to address cognition/behavior during functional activity;For patient/therapist safety;To address functional/ADL transfers PT goals addressed during session: Mobility/safety with mobility;Balance;Proper use of DME OT goals addressed during session: Proper use of Adaptive equipment and DME;ADL's and self-care       AM-PAC PT "6 Clicks" Mobility  Outcome Measure Help needed turning from your back to your side while in a flat bed without using bedrails?: A Little Help needed moving from lying on your back to sitting on the side of a flat bed without using bedrails?: A Little Help needed moving to and from a bed to  a chair (including a wheelchair)?: A Little Help needed standing up from a chair using your arms (e.g., wheelchair or bedside chair)?: A Little Help needed to walk in hospital room?: A Little Help needed climbing 3-5 steps with a railing? : A Lot 6 Click Score: 17    End of Session Equipment Utilized During Treatment: Gait belt Activity  Tolerance: Patient tolerated treatment well Patient left: in bed;with call bell/phone within reach Nurse Communication: Mobility status PT Visit Diagnosis: Unsteadiness on feet (R26.81);Other abnormalities of gait and mobility (R26.89);Muscle weakness (generalized) (M62.81);Difficulty in walking, not elsewhere classified (R26.2)    Time: 2979-8921 PT Time Calculation (min) (ACUTE ONLY): 25 min   Charges:   PT Evaluation $PT Eval Moderate Complexity: 1 Mod          Moishe Spice, PT, DPT Acute Rehabilitation Services  Office: [phone]   Orvan Falconer 11/14/2021, 4:03 PM

## 2021-11-14 NOTE — Evaluation (Signed)
Clinical/Bedside Swallow Evaluation Patient Details  Name: Denise Olson MRN: [id number] Date of Birth: Jan 16, 1946  Today's Date: 11/14/2021 Time: SLP Start Time (ACUTE ONLY): 1200 SLP Stop Time (ACUTE ONLY): 1220 SLP Time Calculation (min) (ACUTE ONLY): 20 min  Past Medical History:  Past Medical History:  Diagnosis Date   Breast cancer ([place])    Cancer ([place])    Personal history of chemotherapy    Past Surgical History:  Past Surgical History:  Procedure Laterality Date   HYSTEROTOMY     MASTECTOMY Left    over 20 years ago   HPI:  76 y.o. female with medical history significant of breast cancer, Alzheimer's, COPD, HTN, HLD, nicotine dependence who presented to ED with AMS. She has had progressive decline over the past 3-4 weeks including incontinence, inability to ambulate on own, balance issues, fatigue and sleeping in chair.  According to the husband she was diagnosed with Alzheimer's dementia about 1 year ago but for the last few months she has been showing progressive decline, she has been hallucinating at times, getting more confused,    Assessment / Plan / Recommendation  Clinical Impression  Denise Olson was upright in bed taking meds with RN present upon SLP arrival. RN reports some oral holding and cues required to utilize straw effectively. Pt has PMH of Alzheimer's with spouse reporting a significant decline over the past few weeks. He has been having to hand feed her to get her to eat. She was seen with meal tray of regular solids and thin liquids in addition to med administration. She had some oral holding, requiring cues to wash pills down. She was easily distractable and required cues to continue intake.   Overall, no concerns for aspiration were observed, but an oral dysphagia (consistent with Alzheimer's) is seen. She is recommended for regular consistency solids, thin liquids, meds whole with liquid OR in puree. She is expected to require cues to swallow intermittently.  She will need 1:1 A for all PO intake. No further ST indicated on the acute level. May benefit from ST at next level of care for cognitive strategies/caregiver training and diet management.   SLP Visit Diagnosis: (P) Dysphagia, oral phase (R13.11)    Aspiration Risk   Mild aspiration risk    Diet Recommendation  Regular;Thin liquid   Medication Administration: (P) Whole meds with puree Compensations: (P) Follow solids with liquid Postural Changes: (P) Seated upright at 90 degrees    Other  Recommendations Oral Care Recommendations: (P) Oral care BID    Recommendations for follow up therapy are one component of a multi-disciplinary discharge planning process, led by the attending physician.  Recommendations may be updated based on patient status, additional functional criteria and insurance authorization.  Follow up Recommendations  Home health SLP      Assistance Recommended at Discharge . Frequent or constant Supervision/Assistance  Functional Status Assessment  Patient has had a recent decline in their functional status and demonstrates the ability to make significant improvements in function in a reasonable and predictable amount of time.  Frequency and Duration   N/a         Prognosis   GOOD     Swallow Study   General Date of Onset: 11/13/21 HPI: 76 y.o. female with medical history significant of breast cancer, Alzheimer's, COPD, HTN, HLD, nicotine dependence who presented to ED with AMS. She has had progressive decline over the past 3-4 weeks including incontinence, inability to ambulate on own, balance issues, fatigue and sleeping  in chair.  According to the husband she was diagnosed with Alzheimer's dementia about 1 year ago but for the last few months she has been showing progressive decline, she has been hallucinating at times, getting more confused, Type of Study: Bedside Swallow Evaluation Previous Swallow Assessment: none Diet Prior to this Study: Regular;Thin  liquids Temperature Spikes Noted: No Respiratory Status: Room air History of Recent Intubation: No Behavior/Cognition: Alert;Cooperative;Pleasant mood;Confused;Requires cueing Oral Cavity Assessment: Within Functional Limits Oral Care Completed by SLP: Yes Self-Feeding Abilities: Able to feed self Patient Positioning: Upright in bed Baseline Vocal Quality: Normal Volitional Cough: Strong Volitional Swallow: Able to elicit    Oral/Motor/Sensory Function Overall Oral Motor/Sensory Function: Within functional limits   Ice Chips   DNT  Thin Liquid Thin Liquid: Within functional limits Presentation: Straw;Cup Other Comments: cues to use straw    Puree Puree: Within functional limits Presentation: Spoon   Solid     Solid: Within functional limits Presentation: [place]. Loyalty Arentz, M.S., CCC-SLP Speech-Language Pathologist Acute Rehabilitation Services Pager: [place] 11/14/2021,12:47 PM

## 2021-11-14 NOTE — Care Management (Signed)
Reached out to husband, no answer cannot leave a message no cell phone, for [facility] letter and to discuss discharge planning

## 2021-11-14 NOTE — Procedures (Signed)
Patient Name: Denise Olson  MRN: [id number]  Epilepsy Attending: Lora Havens  Referring Physician/Provider: Orma Flaming, MD Date: 11/13/2021 Duration: 21.42 mins  Patient history: 76 y.o. female with PMH significant for alzheimers dementia who presents with progressived decline in ability to do ADLs and failure to thrive. EEG to evaluate for seizure.  Level of alertness: Awake  AEDs during EEG study: None  Technical aspects: This EEG study was done with scalp electrodes positioned according to the 10-20 International system of electrode placement. Electrical activity was acquired at a sampling rate of '500Hz'$  and reviewed with a high frequency filter of '70Hz'$  and a low frequency filter of '1Hz'$ . EEG data were recorded continuously and digitally stored.   Description: The posterior dominant rhythm consists of 8-9 Hz activity of moderate voltage (25-35 uV) seen predominantly in posterior head regions, symmetric and reactive to eye opening and eye closing. Hyperventilation and photic stimulation were not performed.     Of note, study was technically limited due to significant electrode and movement artifact.   IMPRESSION: This technically difficult study is within normal limits. No seizures or epileptiform discharges were seen throughout the recording.  Mieka Leaton Barbra Sarks

## 2021-11-14 NOTE — Progress Notes (Addendum)
PROGRESS NOTE                                                                                                                                                                                                             Patient Demographics:    Denise Olson, is a 76 y.o. female, DOB - 09-14-1945, FAO:[id number]  Outpatient Primary MD for the patient is [name], MD    LOS - 0  Admit date - 11/13/2021    Chief Complaint  Patient presents with   Altered Mental Status       Brief Narrative (HPI from H&P)     76 y.o. female with medical history significant of breast cancer, Alzheimer's, COPD, HTN, HLD, nicotine dependence who presented to ED with AMS. She has had progressive decline over the past 3-4 weeks including incontinence, inability to ambulate on own, balance issues, fatigue and sleeping in chair.  According to the husband she was diagnosed with Alzheimer's dementia about 1 year ago but for the last few months she has been showing progressive decline, she has been hallucinating at times, getting more confused, on Thursday she laid on the floor and the husband could not get her up for a day, she was brought into the hospital seen by neurology and admitted for further   Subjective:    [name] today has, No headache, No chest pain, No abdominal pain - No Nausea, No new weakness tingling or numbness, no SOB   Assessment  & Plan :    Acute metabolic encephalopathy on top of underlying advanced Alzheimer's dementia worsened with dehydration. She has been progressively declining over the last 3 months, now has been unable to care for herself, she laid on the floor for over 24 hours.  Seen by neurology.  CT head and EEG nonacute, B12 is borderline low, low folate and replacement will be initiated, RPR is negative TSH is normal, pending HIV.  Continue supportive care with IV fluids, PT OT, may require placement as she is unsafe to  be discharged home with elderly husband who is unable to care for her.  Alzheimer's disease ([place]) -  Progressive decline, seems rapid to justify progressing dementia, but could be playing a part.  Per neurology she has been placed on combination of Aricept, Remeron and memantine, at risk for delirium minimize benzodiazepines and  narcotics. Continue to monitor with supportive care as above.  Elevated troponin - Troponin mildly bumped and flat. She denies any chest pain, no findings on EKG, Not consistent with ACS.  On aspirin and statin.  Continue.  Not a candidate for invasive testing or procedures.   Essential hypertension - Continue altace '5mg'$  daily   Mixed hyperlipidemia -  Continue lipitor daily   History of breast cancer - Continue evista   Nicotine dependence - Per husband only smokes a few cigarettes a day and has not really been interested in last few days Declines nicotine patch at this time, counseled to quit.       Condition - Fair  Family Communication  :  husband [name] [phone] on 11/14/21  Code Status :  Full  Consults  :  Neuro  PUD Prophylaxis :     Procedures  :      EEG - Non acute  CT - 1. No definite acute intracranial abnormality. 2. There is ventricular enlargement greater than sulcal enlargement. Pattern of ventricular enlargement raises the possibility of communicating hydrocephalus. Both the ventricular and sulcal enlargement is new compared to the CT from 01/28/2013.       Disposition Plan  :    Status is: Observation  DVT Prophylaxis  :    enoxaparin (LOVENOX) injection 30 mg Start: 11/13/21 1800   Lab Results  Component Value Date   PLT 323 11/14/2021    Diet :  Diet Order             Diet regular Room service appropriate? Yes; Fluid consistency: Thin  Diet effective now                    Inpatient Medications  Scheduled Meds:  acetaminophen  650 mg Oral Q6H   aspirin  81 mg Oral Daily   atorvastatin  40 mg Oral  Daily   cyanocobalamin  1,000 mcg Subcutaneous Daily   donepezil  10 mg Oral QHS   enoxaparin (LOVENOX) injection  30 mg Subcutaneous Q24H   memantine  5 mg Oral Daily   mirtazapine  7.5 mg Oral QHS   multivitamin with minerals  1 tablet Oral Daily   raloxifene  60 mg Oral Daily   ramipril  5 mg Oral Daily   [START ON 11/17/2021] vitamin B-12  1,000 mcg Oral Daily   Continuous Infusions:  lactated ringers 75 mL/hr at 11/13/21 1800   PRN Meds:.acetaminophen **OR** acetaminophen, hydrALAZINE  Antibiotics  :    Anti-infectives (From admission, onward)    None        Time Spent in minutes  30   Lala Lund M.D on 11/14/2021 at 9:37 AM  To page go to [email or web address]   [facility] -  Office  [phone]  See all Orders from today for further details    Objective:   Vitals:   11/14/21 0815 11/14/21 0830 11/14/21 0845 11/14/21 0901  BP: (!) 123/59 (!) 139/43 (!) 115/43   Pulse: 61 95 63   Resp: '15 13 11   '$ Temp:    97.7 F (36.5 C)  TempSrc:    Oral  SpO2: 100%  95%   Weight:      Height:        Wt Readings from Last 3 Encounters:  11/13/21 50 kg  05/20/21 50.3 kg  11/12/20 52.3 kg    No intake or output data in the 24 hours ending 11/14/21 8502   Physical Exam  Awake but confused, No new F.N deficits, Normal affect [place].AT,PERRAL Supple Neck, No JVD,   Symmetrical Chest wall movement, Good air movement bilaterally, CTAB RRR,No Gallops,Rubs or new Murmurs,  +ve B.Sounds, Abd Soft, No tenderness,   No Cyanosis, Clubbing or edema      Data Review:    CBC Recent Labs  Lab 11/13/21 1247 11/14/21 0405  WBC 9.7 9.3  HGB 15.4* 12.5  HCT 46.3* 37.8  PLT 377 323  MCV 91.3 91.5  MCH 30.4 30.3  MCHC 33.3 33.1  RDW 13.8 13.9    Electrolytes Recent Labs  Lab 11/13/21 1247 11/13/21 1605 11/14/21 0405  NA 139  --  138  K 4.0  --  3.6  CL 104  --  108  CO2 23  --  23  GLUCOSE 119*  --  100*  BUN 9  --  12  CREATININE 0.90  --  0.91   CALCIUM 9.3  --  8.4*  AST 25  --   --   ALT 14  --   --   ALKPHOS 79  --   --   BILITOT 0.9  --   --   ALBUMIN 3.7  --   --   MG  --   --  1.8  TSH  --  0.739  --   AMMONIA  --  23  --     Recent Labs    11/13/21 1605  TSH 0.739      Radiology Reports EEG adult  Result Date: 11/14/2021 Lora Havens, MD     11/14/2021  8:29 AM Patient Name: Denise Olson Tailor MRN: [id number] Epilepsy Attending: Lora Havens Referring Physician/Provider: Orma Flaming, MD Date: 11/13/2021 Duration: 21.42 mins Patient history: 76 y.o. female with PMH significant for alzheimers dementia who presents with progressived decline in ability to do ADLs and failure to thrive. EEG to evaluate for seizure. Level of alertness: Awake AEDs during EEG study: None Technical aspects: This EEG study was done with scalp electrodes positioned according to the 10-20 International system of electrode placement. Electrical activity was acquired at a sampling rate of '500Hz'$  and reviewed with a high frequency filter of '70Hz'$  and a low frequency filter of '1Hz'$ . EEG data were recorded continuously and digitally stored. Description: The posterior dominant rhythm consists of 8-9 Hz activity of moderate voltage (25-35 uV) seen predominantly in posterior head regions, symmetric and reactive to eye opening and eye closing. Hyperventilation and photic stimulation were not performed.   Of note, study was technically limited due to significant electrode and movement artifact. IMPRESSION: This technically difficult study is within normal limits. No seizures or epileptiform discharges were seen throughout the recording. Lora Havens   CT Head Wo Contrast  Result Date: 11/13/2021 CLINICAL DATA:  Patient slid out of her chair. Found on her back. Altered mental status. EXAM: CT HEAD WITHOUT CONTRAST TECHNIQUE: Contiguous axial images were obtained from the base of the skull through the vertex without intravenous contrast. RADIATION DOSE  REDUCTION: This exam was performed according to the departmental dose-optimization program which includes automated exposure control, adjustment of the mA and/or kV according to patient size and/or use of iterative reconstruction technique. COMPARISON:  01/28/2013. FINDINGS: Brain: No evidence of acute infarction, hemorrhage, hydrocephalus, extra-axial collection or mass lesion/mass effect. There is ventricular greater than sulcal enlargement consistent with mild diffuse atrophy, which has significantly increased when compared to the prior head CT. Patchy areas of periventricular white matter hypoattenuation are also noted  consistent with mild chronic microvascular ischemic change. Vascular: No hyperdense vessel or unexpected calcification. Skull: Normal. Negative for fracture or focal lesion. Sinuses/Orbits: Globes and orbits are unremarkable. The visualized sinuses are clear. Other: None. IMPRESSION: 1. No definite acute intracranial abnormality. 2. There is ventricular enlargement greater than sulcal enlargement. Pattern of ventricular enlargement raises the possibility of communicating hydrocephalus. Both the ventricular and sulcal enlargement is new compared to the CT from 01/28/2013. Electronically Signed   By: Lajean Manes M.D.   On: 11/13/2021 14:21   DG Chest Portable 1 View  Result Date: 11/13/2021 CLINICAL DATA:  Fall, pain EXAM: PORTABLE CHEST 1 VIEW COMPARISON:  08/18/2011 FINDINGS: Cardiomegaly. Both lungs are clear. Chronic fracture deformity of the proximal right humerus. 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 IMPRESSION: Cardiomegaly. No acute abnormality of the lungs in AP portable projection. Electronically Signed   By: Delanna Ahmadi M.D.   On: 11/13/2021 13:48   DG Pelvis Portable  Result Date: 11/13/2021 CLINICAL DATA:  Trauma, fall, pain EXAM: PORTABLE PELVIS 1-2 VIEWS COMPARISON:  None Available. FINDINGS: No displaced fracture or dislocation is seen. Degenerative  changes are noted in lower lumbar spine and both SI joints. IMPRESSION: No recent fracture or dislocation is seen in pelvis. Electronically Signed   By: Elmer Picker M.D.   On: 11/13/2021 13:48   DG Shoulder Right  Result Date: 11/13/2021 CLINICAL DATA:  Fall, pain EXAM: RIGHT SHOULDER - 2+ VIEW COMPARISON:  03/02/2015 FINDINGS: There is no evidence of acute fracture or dislocation. Chronic fracture deformity of the proximal right humerus, seen acutely on examination dated 03/02/2015. Mild acromioclavicular and glenohumeral arthrosis. Soft tissues are unremarkable. IMPRESSION: 1.  No acute fracture or dislocation of the right shoulder. 2. Chronic fracture deformity of the proximal right humerus, seen acutely on examination dated 03/02/2015. Electronically Signed   By: Delanna Ahmadi M.D.   On: 11/13/2021 13:47

## 2021-11-15 DIAGNOSIS — G8929 Other chronic pain: Secondary | ICD-10-CM | POA: Diagnosis present

## 2021-11-15 DIAGNOSIS — G9341 Metabolic encephalopathy: Secondary | ICD-10-CM | POA: Diagnosis present

## 2021-11-15 DIAGNOSIS — Z9012 Acquired absence of left breast and nipple: Secondary | ICD-10-CM | POA: Diagnosis not present

## 2021-11-15 DIAGNOSIS — F17200 Nicotine dependence, unspecified, uncomplicated: Secondary | ICD-10-CM | POA: Diagnosis present

## 2021-11-15 DIAGNOSIS — F0280 Dementia in other diseases classified elsewhere without behavioral disturbance: Secondary | ICD-10-CM | POA: Diagnosis not present

## 2021-11-15 DIAGNOSIS — Z7401 Bed confinement status: Secondary | ICD-10-CM | POA: Diagnosis not present

## 2021-11-15 DIAGNOSIS — R1312 Dysphagia, oropharyngeal phase: Secondary | ICD-10-CM | POA: Diagnosis present

## 2021-11-15 DIAGNOSIS — Z7982 Long term (current) use of aspirin: Secondary | ICD-10-CM | POA: Diagnosis not present

## 2021-11-15 DIAGNOSIS — E860 Dehydration: Secondary | ICD-10-CM | POA: Diagnosis present

## 2021-11-15 DIAGNOSIS — Z853 Personal history of malignant neoplasm of breast: Secondary | ICD-10-CM | POA: Diagnosis not present

## 2021-11-15 DIAGNOSIS — B351 Tinea unguium: Secondary | ICD-10-CM | POA: Diagnosis present

## 2021-11-15 DIAGNOSIS — F0282 Dementia in other diseases classified elsewhere, unspecified severity, with psychotic disturbance: Secondary | ICD-10-CM | POA: Diagnosis present

## 2021-11-15 DIAGNOSIS — F17210 Nicotine dependence, cigarettes, uncomplicated: Secondary | ICD-10-CM | POA: Diagnosis present

## 2021-11-15 DIAGNOSIS — M2550 Pain in unspecified joint: Secondary | ICD-10-CM | POA: Diagnosis not present

## 2021-11-15 DIAGNOSIS — M6281 Muscle weakness (generalized): Secondary | ICD-10-CM | POA: Diagnosis present

## 2021-11-15 DIAGNOSIS — Z79899 Other long term (current) drug therapy: Secondary | ICD-10-CM | POA: Diagnosis not present

## 2021-11-15 DIAGNOSIS — R32 Unspecified urinary incontinence: Secondary | ICD-10-CM | POA: Diagnosis present

## 2021-11-15 DIAGNOSIS — I10 Essential (primary) hypertension: Secondary | ICD-10-CM | POA: Diagnosis present

## 2021-11-15 DIAGNOSIS — E782 Mixed hyperlipidemia: Secondary | ICD-10-CM | POA: Diagnosis present

## 2021-11-15 DIAGNOSIS — G309 Alzheimer's disease, unspecified: Secondary | ICD-10-CM | POA: Diagnosis present

## 2021-11-15 DIAGNOSIS — R41841 Cognitive communication deficit: Secondary | ICD-10-CM | POA: Diagnosis present

## 2021-11-15 DIAGNOSIS — R778 Other specified abnormalities of plasma proteins: Secondary | ICD-10-CM | POA: Diagnosis present

## 2021-11-15 DIAGNOSIS — R2681 Unsteadiness on feet: Secondary | ICD-10-CM | POA: Diagnosis present

## 2021-11-15 DIAGNOSIS — Z9221 Personal history of antineoplastic chemotherapy: Secondary | ICD-10-CM | POA: Diagnosis not present

## 2021-11-15 DIAGNOSIS — G9340 Encephalopathy, unspecified: Secondary | ICD-10-CM | POA: Diagnosis present

## 2021-11-15 DIAGNOSIS — R627 Adult failure to thrive: Secondary | ICD-10-CM | POA: Diagnosis present

## 2021-11-15 DIAGNOSIS — J449 Chronic obstructive pulmonary disease, unspecified: Secondary | ICD-10-CM | POA: Diagnosis present

## 2021-11-15 DIAGNOSIS — Z20822 Contact with and (suspected) exposure to covid-19: Secondary | ICD-10-CM | POA: Diagnosis present

## 2021-11-15 LAB — URINE CULTURE

## 2021-11-15 LAB — HIV ANTIBODY (ROUTINE TESTING W REFLEX): HIV Screen 4th Generation wRfx: NONREACTIVE

## 2021-11-15 NOTE — Progress Notes (Signed)
PROGRESS NOTE                                                                                                                                                                                                             Patient Demographics:    Denise Olson, is a 76 y.o. female, DOB - [birth year], BEM:[id number]  Outpatient Primary MD for the patient is [name], MD    LOS - 0  Admit date - 11/13/2021    Chief Complaint  Patient presents with   Altered Mental Status       Brief Narrative (HPI from H&P)     76 y.o. female with medical history significant of breast cancer, Alzheimer's, COPD, HTN, HLD, nicotine dependence who presented to ED with AMS. She has had progressive decline over the past 3-4 weeks including incontinence, inability to ambulate on own, balance issues, fatigue and sleeping in chair.  According to the husband she was diagnosed with Alzheimer's dementia about 1 year ago but for the last few months she has been showing progressive decline, she has been hallucinating at times, getting more confused, on Thursday she laid on the floor and the husband could not get her up for a day, she was brought into the hospital seen by neurology and admitted for further   Subjective:   Patient in bed, appears comfortable, denies any headache, no fever, no chest pain or pressure, no shortness of breath , no abdominal pain. No new focal weakness.    Assessment  & Plan :    Acute metabolic encephalopathy on top of underlying advanced Alzheimer's dementia worsened with dehydration. She has been progressively declining over the last 3 months, now has been unable to care for herself, she laid on the floor for over 24 hours.  Seen by neurology.  CT head and EEG nonacute, B12 is borderline low, low folate and replacement will be initiated, HIV and RPR negative TSH is normal. Continue supportive care with IV fluids, PT OT, will require  SNF as she is unsafe to be discharged home with elderly husband who is unable to care for her.  Alzheimer's disease ([place]) -  Progressive decline, seems rapid to justify progressing dementia, but could be playing a part.  Per neurology she has been placed on combination of Aricept, Remeron and memantine, at risk for delirium minimize  benzodiazepines and narcotics. Continue to monitor with supportive care as above.  Elevated troponin - Troponin mildly bumped and flat. She denies any chest pain, no findings on EKG, Not consistent with ACS.  On aspirin and statin.  Continue.  Not a candidate for invasive testing or procedures.   Essential hypertension - Continue altace '5mg'$  daily   Mixed hyperlipidemia -  Continue lipitor daily   History of breast cancer - Continue evista   Nicotine dependence - Per husband only smokes a few cigarettes a day and has not really been interested in last few days Declines nicotine patch at this time, counseled to quit.       Condition - Fair  Family Communication  :  husband [name] [phone] on 11/14/21  Code Status :  Full  Consults  :  Neuro  PUD Prophylaxis :     Procedures  :     EEG - Non acute  CT - 1. No definite acute intracranial abnormality. 2. There is ventricular enlargement greater than sulcal enlargement. Pattern of ventricular enlargement raises the possibility of communicating hydrocephalus. Both the ventricular and sulcal enlargement is new compared to the CT from 01/28/2013.       Disposition Plan  :    Status is: Observation  DVT Prophylaxis  :    enoxaparin (LOVENOX) injection 40 mg Start: 11/14/21 1800   Lab Results  Component Value Date   PLT 323 11/14/2021    Diet :  Diet Order             Diet regular Room service appropriate? No; Fluid consistency: Thin  Diet effective now                    Inpatient Medications  Scheduled Meds:  aspirin  81 mg Oral Daily   atorvastatin  40 mg Oral Daily    cyanocobalamin  1,000 mcg Subcutaneous Daily   donepezil  10 mg Oral QHS   enoxaparin (LOVENOX) injection  40 mg Subcutaneous A12I   folic acid  1 mg Oral Daily   memantine  5 mg Oral Daily   mirtazapine  7.5 mg Oral QHS   multivitamin with minerals  1 tablet Oral Daily   raloxifene  60 mg Oral Daily   ramipril  5 mg Oral Daily   [START ON 11/17/2021] vitamin B-12  1,000 mcg Oral Daily   Continuous Infusions:  lactated ringers Stopped (11/13/21 2240)   PRN Meds:.acetaminophen **OR** acetaminophen, hydrALAZINE  Antibiotics  :    Anti-infectives (From admission, onward)    None        Time Spent in minutes  30   Lala Lund M.D on 11/15/2021 at 11:09 AM  To page go to [email or web address]   [facility] -  Office  [phone]  See all Orders from today for further details    Objective:   Vitals:   11/14/21 2320 11/14/21 2325 11/15/21 0440 11/15/21 0800  BP:    (!) 139/45  Pulse: 84 74  63  Resp: '15 19  16  '$ Temp:      TempSrc:      SpO2: 92% 96%  97%  Weight:   55 kg   Height:        Wt Readings from Last 3 Encounters:  11/15/21 55 kg  05/20/21 50.3 kg  11/12/20 52.3 kg     Intake/Output Summary (Last 24 hours) at 11/15/2021 1109 Last data filed at 11/14/2021 1700 Gross per 24 hour  Intake  257.38 ml  Output --  Net 257.38 ml     Physical Exam  Awake but confused, No new F.N deficits, Normal affect [place].AT,PERRAL Supple Neck, No JVD,   Symmetrical Chest wall movement, Good air movement bilaterally, CTAB RRR,No Gallops, Rubs or new Murmurs,  +ve B.Sounds, Abd Soft, No tenderness,   No Cyanosis, Clubbing or edema    Data Review:    CBC Recent Labs  Lab 11/13/21 1247 11/14/21 0405  WBC 9.7 9.3  HGB 15.4* 12.5  HCT 46.3* 37.8  PLT 377 323  MCV 91.3 91.5  MCH 30.4 30.3  MCHC 33.3 33.1  RDW 13.8 13.9    Electrolytes Recent Labs  Lab 11/13/21 1247 11/13/21 1605 11/14/21 0405  NA 139  --  138  K 4.0  --  3.6  CL 104  --  108   CO2 23  --  23  GLUCOSE 119*  --  100*  BUN 9  --  12  CREATININE 0.90  --  0.91  CALCIUM 9.3  --  8.4*  AST 25  --   --   ALT 14  --   --   ALKPHOS 79  --   --   BILITOT 0.9  --   --   ALBUMIN 3.7  --   --   MG  --   --  1.8  TSH  --  0.739  --   AMMONIA  --  23  --     Recent Labs    11/13/21 1605  TSH 0.739      Radiology Reports EEG adult  Result Date: 11/14/2021 Lora Havens, MD     11/14/2021  8:29 AM Patient Name: Corona Popovich MRN: [id number] Epilepsy Attending: Lora Havens Referring Physician/Provider: Orma Flaming, MD Date: 11/13/2021 Duration: 21.42 mins Patient history: 76 y.o. female with PMH significant for alzheimers dementia who presents with progressived decline in ability to do ADLs and failure to thrive. EEG to evaluate for seizure. Level of alertness: Awake AEDs during EEG study: None Technical aspects: This EEG study was done with scalp electrodes positioned according to the 10-20 International system of electrode placement. Electrical activity was acquired at a sampling rate of '500Hz'$  and reviewed with a high frequency filter of '70Hz'$  and a low frequency filter of '1Hz'$ . EEG data were recorded continuously and digitally stored. Description: The posterior dominant rhythm consists of 8-9 Hz activity of moderate voltage (25-35 uV) seen predominantly in posterior head regions, symmetric and reactive to eye opening and eye closing. Hyperventilation and photic stimulation were not performed.   Of note, study was technically limited due to significant electrode and movement artifact. IMPRESSION: This technically difficult study is within normal limits. No seizures or epileptiform discharges were seen throughout the recording. Lora Havens   CT Head Wo Contrast  Result Date: 11/13/2021 CLINICAL DATA:  Patient slid out of her chair. Found on her back. Altered mental status. EXAM: CT HEAD WITHOUT CONTRAST TECHNIQUE: Contiguous axial images were obtained from the  base of the skull through the vertex without intravenous contrast. RADIATION DOSE REDUCTION: This exam was performed according to the departmental dose-optimization program which includes automated exposure control, adjustment of the mA and/or kV according to patient size and/or use of iterative reconstruction technique. COMPARISON:  01/28/2013. FINDINGS: Brain: No evidence of acute infarction, hemorrhage, hydrocephalus, extra-axial collection or mass lesion/mass effect. There is ventricular greater than sulcal enlargement consistent with mild diffuse atrophy, which has significantly increased when compared  to the prior head CT. Patchy areas of periventricular white matter hypoattenuation are also noted consistent with mild chronic microvascular ischemic change. Vascular: No hyperdense vessel or unexpected calcification. Skull: Normal. Negative for fracture or focal lesion. Sinuses/Orbits: Globes and orbits are unremarkable. The visualized sinuses are clear. Other: None. IMPRESSION: 1. No definite acute intracranial abnormality. 2. There is ventricular enlargement greater than sulcal enlargement. Pattern of ventricular enlargement raises the possibility of communicating hydrocephalus. Both the ventricular and sulcal enlargement is new compared to the CT from 01/28/2013. Electronically Signed   By: Lajean Manes M.D.   On: 11/13/2021 14:21   DG Chest Portable 1 View  Result Date: 11/13/2021 CLINICAL DATA:  Fall, pain EXAM: PORTABLE CHEST 1 VIEW COMPARISON:  08/18/2011 FINDINGS: Cardiomegaly. Both lungs are clear. Chronic fracture deformity of the proximal right humerus. 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 IMPRESSION: Cardiomegaly. No acute abnormality of the lungs in AP portable projection. Electronically Signed   By: Delanna Ahmadi M.D.   On: 11/13/2021 13:48   DG Pelvis Portable  Result Date: 11/13/2021 CLINICAL DATA:  Trauma, fall, pain EXAM: PORTABLE PELVIS 1-2 VIEWS COMPARISON:  None  Available. FINDINGS: No displaced fracture or dislocation is seen. Degenerative changes are noted in lower lumbar spine and both SI joints. IMPRESSION: No recent fracture or dislocation is seen in pelvis. Electronically Signed   By: Elmer Picker M.D.   On: 11/13/2021 13:48   DG Shoulder Right  Result Date: 11/13/2021 CLINICAL DATA:  Fall, pain EXAM: RIGHT SHOULDER - 2+ VIEW COMPARISON:  03/02/2015 FINDINGS: There is no evidence of acute fracture or dislocation. Chronic fracture deformity of the proximal right humerus, seen acutely on examination dated 03/02/2015. Mild acromioclavicular and glenohumeral arthrosis. Soft tissues are unremarkable. IMPRESSION: 1.  No acute fracture or dislocation of the right shoulder. 2. Chronic fracture deformity of the proximal right humerus, seen acutely on examination dated 03/02/2015. Electronically Signed   By: Delanna Ahmadi M.D.   On: 11/13/2021 13:47

## 2021-11-15 NOTE — Progress Notes (Signed)
Occupational Therapy Treatment Patient Details Name: Denise Olson MRN: [id number] DOB: Jun 25, 1945 Today's Date: 11/15/2021   History of present illness 76 y.o. F admitted on 11/13/21 due to increased confusion, incontinence, and difficulty ambulating. PMH significant for breast cancer, Alzheimer's, COPD, HTN, HLD, nicotine dependence.   OT comments  Supportive husband at bedside. Reports pt did not like to bathe, has not showered in 2 years. Pt is also resistant to oral care. He assist her with dressing and sets her up to self feed. Husband manages all IADLs. Pt has been known to wander outside of the home x 2 in the last 2 months. She is mostly continent, refuses to use adult undergarments. Husband prefers for pt to attempt rehab in SNF prior to her returning home. Husband has been looking into adult daycare centers. Educated husband in devices he may purchase to alert him when pt is up and walking or if she attempts to go outdoors. Pt demonstrated ability to wash hands with set up and self feed with min assist this visit. Discharge disposition updated.    Recommendations for follow up therapy are one component of a multi-disciplinary discharge planning process, led by the attending physician.  Recommendations may be updated based on patient status, additional functional criteria and insurance authorization.    Follow Up Recommendations  Skilled nursing-short term rehab (<3 hours/day)    Assistance Recommended at Discharge Frequent or constant Supervision/Assistance  Patient can return home with the following  A little help with walking and/or transfers;A lot of help with bathing/dressing/bathroom;Assistance with cooking/housework;Direct supervision/assist for medications management;Direct supervision/assist for financial management;Assist for transportation;Help with stairs or ramp for entrance   Equipment Recommendations  Other (comment) (defer to next venue)    Recommendations for Other  Services      Precautions / Restrictions Precautions Precautions: Fall       Mobility Bed Mobility Overal bed mobility: Needs Assistance Bed Mobility: Supine to Sit, Sit to Supine     Supine to sit: Min assist, HOB elevated Sit to supine: Min guard   General bed mobility comments: total assist to position pt in bed to raise head for self feeding    Transfers                         Balance Overall balance assessment: Needs assistance Sitting-balance support: No upper extremity supported, Feet unsupported Sitting balance-Leahy Scale: Fair                                     ADL either performed or assessed with clinical judgement   ADL Overall ADL's : Needs assistance/impaired Eating/Feeding: Minimal assistance;Bed level   Grooming: Wash/dry hands;Bed level;Set up                                      Extremity/Trunk Assessment              Vision       Perception     Praxis      Cognition Arousal/Alertness: Awake/alert Behavior During Therapy: WFL for tasks assessed/performed Overall Cognitive Status: History of cognitive impairments - at baseline  General Comments: pt with hx of Alzheimers dementia        Exercises      Shoulder Instructions       General Comments      Pertinent Vitals/ Pain       Pain Assessment Pain Assessment: Faces Faces Pain Scale: Hurts little more Pain Location: R shoulder Pain Descriptors / Indicators: Aching, Discomfort, Grimacing Pain Intervention(s): Monitored during session, Repositioned  Home Living                                          Prior Functioning/Environment              Frequency  Min 2X/week        Progress Toward Goals  OT Goals(current goals can now be found in the care plan section)  Progress towards OT goals: Progressing toward goals  Acute Rehab OT Goals OT Goal  Formulation: Patient unable to participate in goal setting Time For Goal Achievement: 11/28/21 Potential to Achieve Goals: Good  Plan Discharge plan needs to be updated    Co-evaluation                 AM-PAC OT "6 Clicks" Daily Activity     Outcome Measure   Help from another person eating meals?: A Little Help from another person taking care of personal grooming?: A Little Help from another person toileting, which includes using toliet, bedpan, or urinal?: A Little Help from another person bathing (including washing, rinsing, drying)?: A Lot Help from another person to put on and taking off regular upper body clothing?: A Little Help from another person to put on and taking off regular lower body clothing?: A Lot 6 Click Score: 16    End of Session    OT Visit Diagnosis: Unsteadiness on feet (R26.81);Other abnormalities of gait and mobility (R26.89);Muscle weakness (generalized) (M62.81)   Activity Tolerance Patient tolerated treatment well   Patient Left in bed;with call bell/phone within reach;with bed alarm set;with family/visitor present   Nurse Communication          Time: 8110-3159 OT Time Calculation (min): 37 min  Charges: OT General Charges $OT Visit: 1 Visit OT Treatments $Self Care/Home Management : 23-37 mins  Cleta Alberts, OTR/L Acute Rehabilitation Services Office: [phone]   Malka So 11/15/2021, 2:02 PM

## 2021-11-15 NOTE — Progress Notes (Signed)
Physical Therapy Treatment Patient Details Name: Denise Olson MRN: [id number] DOB: 1946/01/11 Today's Date: 11/15/2021   History of Present Illness 76 y.o. F admitted on 11/13/21 due to increased confusion, incontinence, and difficulty ambulating. PMH significant for breast cancer, Alzheimer's, COPD, HTN, HLD, nicotine dependence.    PT Comments    Pt was seen for mobility on RW with help to take steps but quickly became incontinent of urine.  Pt became agitated over PT trying to assist her as requested to sit on BSC, and then over PT assisting to change her socks and wash her feet. Pt finally was calmer, and assisted her back to bed as pt requested.  Pt is more unfocused at the end of the day, possibly from sundowning. Pt will be followed up with request to see how husband feels about her current fluctuating functional level, and may be appropriate to consider her for LTC options.  Follow for goals of acute PT, and work on getting home functionally.  Recommendations for follow up therapy are one component of a multi-disciplinary discharge planning process, led by the attending physician.  Recommendations may be updated based on patient status, additional functional criteria and insurance authorization.  Follow Up Recommendations  Home health PT     Assistance Recommended at Discharge Frequent or constant Supervision/Assistance  Patient can return home with the following A little help with walking and/or transfers;A little help with bathing/dressing/bathroom;Assistance with cooking/housework;Assistance with feeding;Direct supervision/assist for medications management;Direct supervision/assist for financial management;Assist for transportation;Help with stairs or ramp for entrance   Equipment Recommendations  Rolling walker (2 wheels);BSC/3in1    Recommendations for Other Services       Precautions / Restrictions Precautions Precautions: Fall Restrictions Weight Bearing Restrictions:  No     Mobility  Bed Mobility Overal bed mobility: Needs Assistance Bed Mobility: Supine to Sit, Sit to Supine     Supine to sit: Min assist, HOB elevated Sit to supine: Min assist   General bed mobility comments: pt can help to get into bed but struggles with sequence    Transfers Overall transfer level: Needs assistance Equipment used: Rolling walker (2 wheels) Transfers: Sit to/from Stand Sit to Stand: Min assist                Ambulation/Gait Ambulation/Gait assistance: Herbalist (Feet): 12 Feet Assistive device: Rolling walker (2 wheels) Gait Pattern/deviations: Step-through pattern, Step-to pattern, Decreased stride length Gait velocity: reduced Gait velocity interpretation: <1.8 ft/sec, indicate of risk for recurrent falls Pre-gait activities: standing balance ck General Gait Details: steps are awkward and uncoordinated on walker   Stairs             Wheelchair Mobility    Modified Rankin (Stroke Patients Only)       Balance Overall balance assessment: Needs assistance Sitting-balance support: Feet supported Sitting balance-Leahy Scale: Fair     Standing balance support: Bilateral upper extremity supported, No upper extremity supported, During functional activity Standing balance-Leahy Scale: Fair Standing balance comment: pt is struggling with the sequencing of walker use, releases at times and then cannot resume use automatically                            Cognition Arousal/Alertness: Awake/alert Behavior During Therapy: WFL for tasks assessed/performed, Agitated Overall Cognitive Status: History of cognitive impairments - at baseline  General Comments: Alz, has moments of aggression and verbalization of agitation        Exercises      General Comments General comments (skin integrity, edema, etc.): pt was seen for mobiltiy and assisted her to start walking  and had urine incontinence suddenly.  Assisted to [facility] but pt would not sit down for 2 minutes due to apparent poor understanding despite asking to use toilet.  Pt finally agreed to sit after first turning and expressing anger at PT for trying to assist her to sit      Pertinent Vitals/Pain Pain Assessment Pain Assessment: Faces Faces Pain Scale: Hurts little more Pain Location: general discomfort Pain Descriptors / Indicators: Grimacing, Guarding Pain Intervention(s): Limited activity within patient's tolerance, Repositioned, Monitored during session    Home Living                          Prior Function            PT Goals (current goals can now be found in the care plan section) Acute Rehab PT Goals Patient Stated Goal: to get warm    Frequency    Min 3X/week      PT Plan Current plan remains appropriate    Co-evaluation              AM-PAC PT "6 Clicks" Mobility   Outcome Measure  Help needed turning from your back to your side while in a flat bed without using bedrails?: A Little Help needed moving from lying on your back to sitting on the side of a flat bed without using bedrails?: A Little Help needed moving to and from a bed to a chair (including a wheelchair)?: A Little Help needed standing up from a chair using your arms (e.g., wheelchair or bedside chair)?: A Little Help needed to walk in hospital room?: A Little Help needed climbing 3-5 steps with a railing? : A Little 6 Click Score: 18    End of Session Equipment Utilized During Treatment: Gait belt Activity Tolerance: Patient tolerated treatment well;Treatment limited secondary to agitation Patient left: in bed;with call bell/phone within reach Nurse Communication: Mobility status PT Visit Diagnosis: Unsteadiness on feet (R26.81);Other abnormalities of gait and mobility (R26.89);Muscle weakness (generalized) (M62.81);Difficulty in walking, not elsewhere classified (R26.2)     Time:  4742-5956 PT Time Calculation (min) (ACUTE ONLY): 26 min  Charges:  $Gait Training: 8-22 mins $Therapeutic Activity: 8-22 mins        Ramond Dial 11/15/2021, 8:42 PM  Mee Hives, PT PhD Acute Rehab Dept. Number: [place] [facility] [place]

## 2021-11-16 DIAGNOSIS — G9340 Encephalopathy, unspecified: Secondary | ICD-10-CM | POA: Diagnosis not present

## 2021-11-16 LAB — VITAMIN B1: Vitamin B1 (Thiamine): 72.1 nmol/L (ref 66.5–200.0)

## 2021-11-16 NOTE — NC FL2 (Signed)
[place] LEVEL OF CARE SCREENING TOOL     IDENTIFICATION  Patient Name: Denise Olson Birthdate: Dec 01, 1945 Sex: female Admission Date (Current Location): 11/13/2021  [facility] and [place] Number:  Herbalist and Address:  [facility] [place] [address], [place], [place] [address]      Provider Number: [id number]  Attending Physician Name and Address:  Thurnell Lose, MD  Relative Name and Phone Number:       Current Level of Care: Hospital Recommended Level of Care: [place] Prior Approval Number:    Date Approved/Denied:   PASRR Number: [id number]  Discharge Plan: SNF    Current Diagnoses: Patient Active Problem List   Diagnosis Date Noted   Essential hypertension 11/13/2021   Mixed hyperlipidemia 11/13/2021   Nicotine dependence 11/13/2021   Acute encephalopathy 11/13/2021   History of breast cancer 11/13/2021   Elevated troponin 11/13/2021   Alzheimer's disease ([place]) 05/20/2021   Pain due to onychomycosis of toenails of both feet 08/17/2020    Orientation RESPIRATION BLADDER Height & Weight     Self, Place  Normal Continent, External catheter Weight: 121 lb 4.1 oz (55 kg) Height:  '5\' 3"'$  (160 cm)  BEHAVIORAL SYMPTOMS/MOOD NEUROLOGICAL BOWEL NUTRITION STATUS      Continent Diet (See DC summary)  AMBULATORY STATUS COMMUNICATION OF NEEDS Skin   Limited Assist Verbally Normal                       Personal Care Assistance Level of Assistance  Bathing, Feeding, Dressing Bathing Assistance: Limited assistance Feeding assistance: Independent Dressing Assistance: Limited assistance     Functional Limitations Info  Sight, Hearing, Speech Sight Info: Adequate Hearing Info: Adequate Speech Info: Adequate    SPECIAL CARE FACTORS FREQUENCY  PT (By licensed PT), OT (By licensed OT)     PT Frequency: 5x week OT Frequency: 5x week            Contractures Contractures Info: Not  present    Additional Factors Info  Code Status, Allergies, Psychotropic Code Status Info: Full Allergies Info: Codeine   Hydrocodone-ibuprofen   Penicillins   Latex Psychotropic Info: Donepezil         Current Medications (11/16/2021):  This is the current hospital active medication list Current Facility-Administered Medications  Medication Dose Route Frequency Provider Last Rate Last Admin   acetaminophen (TYLENOL) tablet 650 mg  650 mg Oral Q6H PRN Orma Flaming, MD       Or   acetaminophen (TYLENOL) suppository 650 mg  650 mg Rectal Q6H PRN Orma Flaming, MD       aspirin chewable tablet 81 mg  81 mg Oral Daily Orma Flaming, MD   81 mg at 11/16/21 1056   atorvastatin (LIPITOR) tablet 40 mg  40 mg Oral Daily Orma Flaming, MD   40 mg at 11/16/21 1056   donepezil (ARICEPT) tablet 10 mg  10 mg Oral QHS Orma Flaming, MD   10 mg at 11/15/21 2255   enoxaparin (LOVENOX) injection 40 mg  40 mg Subcutaneous Q24H Thurnell Lose, MD   40 mg at 41/74/08 1448   folic acid (FOLVITE) tablet 1 mg  1 mg Oral Daily Thurnell Lose, MD   1 mg at 11/16/21 1056   hydrALAZINE (APRESOLINE) injection 2 mg  2 mg Intravenous Q6H PRN Orma Flaming, MD       lactated ringers infusion   Intravenous Continuous Orma Flaming,  MD   Stopped at 11/13/21 2240   memantine (NAMENDA) tablet 5 mg  5 mg Oral Daily Donnetta Simpers, MD   5 mg at 11/16/21 1056   mirtazapine (REMERON) tablet 7.5 mg  7.5 mg Oral QHS Orma Flaming, MD   7.5 mg at 11/15/21 2255   multivitamin with minerals tablet 1 tablet  1 tablet Oral Daily Donnetta Simpers, MD   1 tablet at 11/16/21 1056   raloxifene (EVISTA) tablet 60 mg  60 mg Oral Daily Orma Flaming, MD   60 mg at 11/16/21 1056   ramipril (ALTACE) capsule 5 mg  5 mg Oral Daily Orma Flaming, MD   5 mg at 11/16/21 1057   [START ON 11/17/2021] vitamin B-12 (CYANOCOBALAMIN) tablet 1,000 mcg  1,000 mcg Oral Daily Thurnell Lose, MD         Discharge  Medications: Please see discharge summary for a list of discharge medications.  Relevant Imaging Results:  Relevant Lab Results:   Additional Information SS# [id number], [place]

## 2021-11-16 NOTE — Progress Notes (Signed)
PROGRESS NOTE                                                                                                                                                                                                             Patient Demographics:    Denise Olson, is a 76 y.o. female, DOB - Jun 01, 1945, BJY:[id number]  Outpatient Primary MD for the patient is [name], MD    LOS - 1  Admit date - 11/13/2021    Chief Complaint  Patient presents with   Altered Mental Status       Brief Narrative (HPI from H&P)     76 y.o. female with medical history significant of breast cancer, Alzheimer's, COPD, HTN, HLD, nicotine dependence who presented to ED with AMS. She has had progressive decline over the past 3-4 weeks including incontinence, inability to ambulate on own, balance issues, fatigue and sleeping in chair.  According to the husband she was diagnosed with Alzheimer's dementia about 1 year ago but for the last few months she has been showing progressive decline, she has been hallucinating at times, getting more confused, on Thursday she laid on the floor and the husband could not get her up for a day, she was brought into the hospital seen by neurology and admitted for further   Subjective:   Patient in bed, appears comfortable, denies any headache, no fever, no chest pain or pressure, no shortness of breath , no abdominal pain. No new focal weakness.   Assessment  & Plan :    Acute metabolic encephalopathy on top of underlying advanced Alzheimer's dementia worsened with dehydration. She has been progressively declining over the last 3 months, now has been unable to care for herself, she laid on the floor for over 24 hours.  Seen by neurology.  CT head and EEG nonacute, B12 is borderline low, low folate and replacement will be initiated, HIV and RPR negative TSH is normal. Continue supportive care with IV fluids, PT OT, will require  SNF. I discussed her plan with her husband again on 11/16/2021 he does not feel comfortable taking her home as he is unable to care for her.  Alzheimer's disease ([place]) -  Progressive decline, seems rapid to justify progressing dementia, but could be playing a part.  Per neurology she has been placed on combination of Aricept, Remeron  and memantine, at risk for delirium minimize benzodiazepines and narcotics. Continue to monitor with supportive care as above.  Elevated troponin - Troponin mildly bumped and flat. She denies any chest pain, no findings on EKG, Not consistent with ACS.  On aspirin and statin.  Continue.  Not a candidate for invasive testing or procedures.   Essential hypertension - Continue altace '5mg'$  daily   Mixed hyperlipidemia -  Continue lipitor daily   History of breast cancer - Continue evista   Nicotine dependence - Per husband only smokes a few cigarettes a day and has not really been interested in last few days Declines nicotine patch at this time, counseled to quit.       Condition - Fair  Family Communication  :  husband [name] [phone] on 11/14/21  Code Status :  Full  Consults  :  Neuro  PUD Prophylaxis :     Procedures  :     EEG - Non acute  CT - 1. No definite acute intracranial abnormality. 2. There is ventricular enlargement greater than sulcal enlargement. Pattern of ventricular enlargement raises the possibility of communicating hydrocephalus. Both the ventricular and sulcal enlargement is new compared to the CT from 01/28/2013.       Disposition Plan  :    Status is: Observation  DVT Prophylaxis  :    enoxaparin (LOVENOX) injection 40 mg Start: 11/14/21 1800   Lab Results  Component Value Date   PLT 323 11/14/2021    Diet :  Diet Order             Diet regular Room service appropriate? No; Fluid consistency: Thin  Diet effective now                    Inpatient Medications  Scheduled Meds:  aspirin  81 mg Oral  Daily   atorvastatin  40 mg Oral Daily   cyanocobalamin  1,000 mcg Subcutaneous Daily   donepezil  10 mg Oral QHS   enoxaparin (LOVENOX) injection  40 mg Subcutaneous S56C   folic acid  1 mg Oral Daily   memantine  5 mg Oral Daily   mirtazapine  7.5 mg Oral QHS   multivitamin with minerals  1 tablet Oral Daily   raloxifene  60 mg Oral Daily   ramipril  5 mg Oral Daily   [START ON 11/17/2021] vitamin B-12  1,000 mcg Oral Daily   Continuous Infusions:  lactated ringers Stopped (11/13/21 2240)   PRN Meds:.acetaminophen **OR** acetaminophen, hydrALAZINE  Antibiotics  :    Anti-infectives (From admission, onward)    None        Time Spent in minutes  30   Lala Lund M.D on 11/16/2021 at 8:08 AM  To page go to [email or web address]   [facility] -  Office  [phone]  See all Orders from today for further details    Objective:   Vitals:   11/16/21 0315 11/16/21 0320 11/16/21 0400 11/16/21 0435  BP:   (!) 103/38   Pulse: (!) 57 65 74 (!) 176  Resp:   16   Temp:   98 F (36.7 C)   TempSrc:   Oral   SpO2: 98% (!) 86%  91%  Weight:      Height:        Wt Readings from Last 3 Encounters:  11/15/21 55 kg  05/20/21 50.3 kg  11/12/20 52.3 kg    No intake or output data in the 24 hours  ending 11/16/21 0808    Physical Exam  Awake but confused, 0 x 1, No new F.N deficits, Normal affect [place].AT,PERRAL Supple Neck, No JVD,   Symmetrical Chest wall movement, Good air movement bilaterally, CTAB RRR,No Gallops, Rubs or new Murmurs,  +ve B.Sounds, Abd Soft, No tenderness,   No Cyanosis, Clubbing or edema     Data Review:    CBC Recent Labs  Lab 11/13/21 1247 11/14/21 0405  WBC 9.7 9.3  HGB 15.4* 12.5  HCT 46.3* 37.8  PLT 377 323  MCV 91.3 91.5  MCH 30.4 30.3  MCHC 33.3 33.1  RDW 13.8 13.9    Electrolytes Recent Labs  Lab 11/13/21 1247 11/13/21 1605 11/14/21 0405  NA 139  --  138  K 4.0  --  3.6  CL 104  --  108  CO2 23  --  23  GLUCOSE  119*  --  100*  BUN 9  --  12  CREATININE 0.90  --  0.91  CALCIUM 9.3  --  8.4*  AST 25  --   --   ALT 14  --   --   ALKPHOS 79  --   --   BILITOT 0.9  --   --   ALBUMIN 3.7  --   --   MG  --   --  1.8  TSH  --  0.739  --   AMMONIA  --  23  --     Recent Labs    11/13/21 1605  TSH 0.739      Radiology Reports EEG adult  Result Date: 11/14/2021 Lora Havens, MD     11/14/2021  8:29 AM Patient Name: Denise Olson MRN: [id number] Epilepsy Attending: Lora Havens Referring Physician/Provider: Orma Flaming, MD Date: 11/13/2021 Duration: 21.42 mins Patient history: 76 y.o. female with PMH significant for alzheimers dementia who presents with progressived decline in ability to do ADLs and failure to thrive. EEG to evaluate for seizure. Level of alertness: Awake AEDs during EEG study: None Technical aspects: This EEG study was done with scalp electrodes positioned according to the 10-20 International system of electrode placement. Electrical activity was acquired at a sampling rate of '500Hz'$  and reviewed with a high frequency filter of '70Hz'$  and a low frequency filter of '1Hz'$ . EEG data were recorded continuously and digitally stored. Description: The posterior dominant rhythm consists of 8-9 Hz activity of moderate voltage (25-35 uV) seen predominantly in posterior head regions, symmetric and reactive to eye opening and eye closing. Hyperventilation and photic stimulation were not performed.   Of note, study was technically limited due to significant electrode and movement artifact. IMPRESSION: This technically difficult study is within normal limits. No seizures or epileptiform discharges were seen throughout the recording. Lora Havens   CT Head Wo Contrast  Result Date: 11/13/2021 CLINICAL DATA:  Patient slid out of her chair. Found on her back. Altered mental status. EXAM: CT HEAD WITHOUT CONTRAST TECHNIQUE: Contiguous axial images were obtained from the base of the skull through  the vertex without intravenous contrast. RADIATION DOSE REDUCTION: This exam was performed according to the departmental dose-optimization program which includes automated exposure control, adjustment of the mA and/or kV according to patient size and/or use of iterative reconstruction technique. COMPARISON:  01/28/2013. FINDINGS: Brain: No evidence of acute infarction, hemorrhage, hydrocephalus, extra-axial collection or mass lesion/mass effect. There is ventricular greater than sulcal enlargement consistent with mild diffuse atrophy, which has significantly increased when compared to the prior  head CT. Patchy areas of periventricular white matter hypoattenuation are also noted consistent with mild chronic microvascular ischemic change. Vascular: No hyperdense vessel or unexpected calcification. Skull: Normal. Negative for fracture or focal lesion. Sinuses/Orbits: Globes and orbits are unremarkable. The visualized sinuses are clear. Other: None. IMPRESSION: 1. No definite acute intracranial abnormality. 2. There is ventricular enlargement greater than sulcal enlargement. Pattern of ventricular enlargement raises the possibility of communicating hydrocephalus. Both the ventricular and sulcal enlargement is new compared to the CT from 01/28/2013. Electronically Signed   By: Lajean Manes M.D.   On: 11/13/2021 14:21   DG Chest Portable 1 View  Result Date: 11/13/2021 CLINICAL DATA:  Fall, pain EXAM: PORTABLE CHEST 1 VIEW COMPARISON:  08/18/2011 FINDINGS: Cardiomegaly. Both lungs are clear. Chronic fracture deformity of the proximal right humerus. 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 IMPRESSION: Cardiomegaly. No acute abnormality of the lungs in AP portable projection. Electronically Signed   By: Delanna Ahmadi M.D.   On: 11/13/2021 13:48   DG Pelvis Portable  Result Date: 11/13/2021 CLINICAL DATA:  Trauma, fall, pain EXAM: PORTABLE PELVIS 1-2 VIEWS COMPARISON:  None Available. FINDINGS: No  displaced fracture or dislocation is seen. Degenerative changes are noted in lower lumbar spine and both SI joints. IMPRESSION: No recent fracture or dislocation is seen in pelvis. Electronically Signed   By: Elmer Picker M.D.   On: 11/13/2021 13:48   DG Shoulder Right  Result Date: 11/13/2021 CLINICAL DATA:  Fall, pain EXAM: RIGHT SHOULDER - 2+ VIEW COMPARISON:  03/02/2015 FINDINGS: There is no evidence of acute fracture or dislocation. Chronic fracture deformity of the proximal right humerus, seen acutely on examination dated 03/02/2015. Mild acromioclavicular and glenohumeral arthrosis. Soft tissues are unremarkable. IMPRESSION: 1.  No acute fracture or dislocation of the right shoulder. 2. Chronic fracture deformity of the proximal right humerus, seen acutely on examination dated 03/02/2015. Electronically Signed   By: Delanna Ahmadi M.D.   On: 11/13/2021 13:47

## 2021-11-16 NOTE — TOC Progression Note (Signed)
Transition of Care [facility]) - Progression Note    Patient Details  Name: Denise Olson MRN: [id number] Date of Birth: 1945/12/22  Transition of Care [facility]) CM/SW [place], [place] Phone Number: 11/16/2021, 1:59 PM  Clinical Narrative:    CSW attempted to contact pt's spouse to discuss disposition, was unable to leave VM. Per handoff, Spouses preferred method of communication is e-mail. Spouse has made it clear to the team he is looking for placement at discharge for the pt. CSW sent out pt's referrals for options. CSW advised that this is a short term placement, and he may want to look into LTC options. CSW requested his approval to send a referral to [facility], that can help with placement options. Pt will need a 3 day qualifying inpatient stay to be eligible for SNF, today is day 2. TOC will follow with any bed offers and placement options.    Expected Discharge Plan: [place] Barriers to Discharge: Continued Medical Work up  Expected Discharge Plan and Services Expected Discharge Plan: [place] arrangements for the past 2 months: Single Family Home                                       Social Determinants of Health (SDOH) Interventions    Readmission Risk Interventions     No data to display

## 2021-11-17 DIAGNOSIS — G9340 Encephalopathy, unspecified: Secondary | ICD-10-CM | POA: Diagnosis not present

## 2021-11-17 DIAGNOSIS — F0280 Dementia in other diseases classified elsewhere without behavioral disturbance: Secondary | ICD-10-CM

## 2021-11-17 DIAGNOSIS — G309 Alzheimer's disease, unspecified: Secondary | ICD-10-CM

## 2021-11-17 NOTE — Progress Notes (Addendum)
Physical Therapy Treatment Patient Details Name: Denise Olson MRN: [id number] DOB: 1945/08/19 Today's Date: 11/17/2021   History of Present Illness 76 y.o. F admitted on 11/13/21 due to increased confusion, incontinence, and difficulty ambulating due to encephalopathy along with dementia. PMH significant for breast cancer, Alzheimer's, COPD, HTN, HLD, nicotine dependence.    PT Comments    Pt tolerating session but with periods of aggression/agitation.  Spouse present and interested in SNF - discussed benefits of familiar environment with dementia but could attempt.  She did ambulate in hallway and to bathroom.  Pt did better without RW as she became frustrated when using walker and trying to navigate around obstacles.  Spouse really wants pt to use AD - could try in more open environment.   Session requiring increased time to allow pt increased time to respond to cues and for education.    Recommendations for follow up therapy are one component of a multi-disciplinary discharge planning process, led by the attending physician.  Recommendations may be updated based on patient status, additional functional criteria and insurance authorization.  Follow Up Recommendations  Skilled nursing-short term rehab (<3 hours/day) Can patient physically be transported by private vehicle: No (mod A at times/unpredictible due to dementia)   Assistance Recommended at Discharge Frequent or constant Supervision/Assistance  Patient can return home with the following A little help with walking and/or transfers;A little help with bathing/dressing/bathroom;Assistance with cooking/housework;Assistance with feeding;Direct supervision/assist for medications management;Direct supervision/assist for financial management;Assist for transportation;Help with stairs or ramp for entrance   Equipment Recommendations  BSC/3in1 (further assessment RW vs rollator vs no AD)    Recommendations for Other Services        Precautions / Restrictions Precautions Precautions: Fall     Mobility  Bed Mobility Overal bed mobility: Needs Assistance Bed Mobility: Supine to Sit, Sit to Supine     Supine to sit: Min assist, HOB elevated Sit to supine: Supervision   General bed mobility comments: Significantly increased time and cues to move to supine.    Transfers Overall transfer level: Needs assistance Equipment used: Rolling walker (2 wheels) Transfers: Sit to/from Stand Sit to Stand: Min guard           General transfer comment: From bed and toilet; increased time; min guard to steady.  Pt urinated in toilet and was able to perform ADLs with min guard but required significantly increased time    Ambulation/Gait Ambulation/Gait assistance: Min assist, Mod assist Gait Distance (Feet): 90 Feet (15' then 90') Assistive device: Rolling walker (2 wheels), None Gait Pattern/deviations: Step-to pattern, Decreased stride length, Trunk flexed, Shuffle       General Gait Details: Started with RW to bathroom but pt very frustrated with RW, requiring max cues, unable to navigate RW, RW too far forward , and mod A to maintain balanc eand control RW.  For the remainder of walk did without AD.  Without AD, pt does lean forward and required mod cues for posture and min guard/min A to steady but somewhat improved safety.  Spouse expressed that he would like pt to use walker (he mentioned rollator) so that he could take her out and she could sit if needed.  Discussed that a lot of times with pt's with dementia it is difficult to get them to use AD if they didn't already use one.  Discussed could possibly try rollator in more open enviroment (hallway) but pt will have difficulty in tight spaces.   Stairs  Wheelchair Mobility    Modified Rankin (Stroke Patients Only)       Balance Overall balance assessment: Needs assistance Sitting-balance support: Feet supported Sitting balance-Leahy  Scale: Fair     Standing balance support: Bilateral upper extremity supported, No upper extremity supported, During functional activity Standing balance-Leahy Scale: Poor Standing balance comment: Ambulating without AD but requiring min guard-min A                            Cognition Arousal/Alertness: Awake/alert Behavior During Therapy: WFL for tasks assessed/performed, Agitated Overall Cognitive Status: History of cognitive impairments - at baseline                                 General Comments: Alz, has moments of aggression and verbalization of agitation.  Pt requiring increased time and max multimodal cues.  Agitation and aggression tend to occur with frustration (couldn't get walker around linen bag) or with anything she takes as critizem (ex spouse would try to joke about something she was doing and she became very agitated, verbally aggressive)        Exercises      General Comments General comments (skin integrity, edema, etc.): Educated spouse on poor potential for walker use (see gait). Spouse interested in SNF. Also, educated on trying rehab but pt may do better in familiar environment or memory care.  Also, discussed with dementia pt's will tend to do things the way they always have and is best to give time (pt fiddling with toilet paper to fold).      Pertinent Vitals/Pain Pain Assessment Pain Assessment: No/denies pain    Home Living                          Prior Function            PT Goals (current goals can now be found in the care plan section) Progress towards PT goals: Progressing toward goals    Frequency    Min 3X/week      PT Plan Discharge plan needs to be updated    Co-evaluation              AM-PAC PT "6 Clicks" Mobility   Outcome Measure  Help needed turning from your back to your side while in a flat bed without using bedrails?: A Little Help needed moving from lying on your back to  sitting on the side of a flat bed without using bedrails?: A Lot (cues) Help needed moving to and from a bed to a chair (including a wheelchair)?: A Lot Help needed standing up from a chair using your arms (e.g., wheelchair or bedside chair)?: A Lot Help needed to walk in hospital room?: A Lot Help needed climbing 3-5 steps with a railing? : A Lot 6 Click Score: 13    End of Session Equipment Utilized During Treatment: Gait belt Activity Tolerance: Treatment limited secondary to agitation Patient left: in bed;with call bell/phone within reach;with bed alarm set;with family/visitor present Nurse Communication: Mobility status PT Visit Diagnosis: Unsteadiness on feet (R26.81);Other abnormalities of gait and mobility (R26.89);Muscle weakness (generalized) (M62.81);Difficulty in walking, not elsewhere classified (R26.2)     Time: 1517-6160 PT Time Calculation (min) (ACUTE ONLY): 44 min  Charges:  $Gait Training: 8-22 mins $Therapeutic Activity: 23-37 mins  Abran Richard, PT Acute Rehab [facility] [phone]    Karlton Lemon 11/17/2021, 1:43 PM

## 2021-11-17 NOTE — Progress Notes (Signed)
PROGRESS NOTE                                                                                                                                                                                                             Patient Demographics:    Denise Olson, is a 76 y.o. female, DOB - Mar 28, 1946, LGX:[id number]  Outpatient Primary MD for the patient is [name], MD    LOS - 2  Admit date - 11/13/2021    Chief Complaint  Patient presents with   Altered Mental Status       Brief Narrative (HPI from H&P)     76 y.o. female with medical history significant of breast cancer, Alzheimer's, COPD, HTN, HLD, nicotine dependence who presented to ED with AMS. She has had progressive decline over the past 3-4 weeks including incontinence, inability to ambulate on own, balance issues, fatigue and sleeping in chair.  According to the husband she was diagnosed with Alzheimer's dementia about 1 year ago but for the last few months she has been showing progressive decline, she has been hallucinating at times, getting more confused, on Thursday she laid on the floor and the husband could not get her up for a day, she was brought into the hospital seen by neurology and admitted for further   Subjective:   No significant events overnight as discussed with staff, patient is confused, pleasant, she denies any complaints,    Assessment  & Plan :    Acute metabolic encephalopathy on top of underlying advanced Alzheimer's dementia worsened with dehydration. She has been progressively declining over the last 3 months, now has been unable to care for herself, she laid on the floor for over 24 hours.  Seen by neurology.  CT head and EEG nonacute, B12 is borderline low, low folate and replacement will be initiated, HIV and RPR negative TSH is normal. Continue supportive care with IV fluids, PT OT, will require SNF. I discussed her plan with her husband  again on 11/16/2021 he does not feel comfortable taking her home as he is unable to care for her.  Alzheimer's disease ([place]) -  Progressive decline, seems rapid to justify progressing dementia, but could be playing a part.  Per neurology she has been placed on combination of Aricept, Remeron and memantine, at risk for delirium minimize benzodiazepines and narcotics.  Continue to monitor with supportive care as above.  Elevated troponin - Troponin mildly bumped and flat. She denies any chest pain, no findings on EKG, Not consistent with ACS.  On aspirin and statin.  Continue.  Not a candidate for invasive testing or procedures.   Essential hypertension - Continue altace '5mg'$  daily   Mixed hyperlipidemia -  Continue lipitor daily   History of breast cancer - Continue evista   Nicotine dependence - Per husband only smokes a few cigarettes a day and has not really been interested in last few days Declines nicotine patch at this time, counseled to quit.       Condition - Fair  Family Communication  : None at bedside  Code Status :  Full  Consults  :  Neuro  PUD Prophylaxis :     Procedures  :     EEG - Non acute  CT - 1. No definite acute intracranial abnormality. 2. There is ventricular enlargement greater than sulcal enlargement. Pattern of ventricular enlargement raises the possibility of communicating hydrocephalus. Both the ventricular and sulcal enlargement is new compared to the CT from 01/28/2013.       Disposition Plan  :    Status is: Observation  DVT Prophylaxis  :    enoxaparin (LOVENOX) injection 40 mg Start: 11/14/21 1800   Lab Results  Component Value Date   PLT 323 11/14/2021    Diet :  Diet Order             Diet regular Room service appropriate? No; Fluid consistency: Thin  Diet effective now                    Inpatient Medications  Scheduled Meds:  aspirin  81 mg Oral Daily   atorvastatin  40 mg Oral Daily   donepezil  10 mg Oral QHS    enoxaparin (LOVENOX) injection  40 mg Subcutaneous Z30Q   folic acid  1 mg Oral Daily   memantine  5 mg Oral Daily   mirtazapine  7.5 mg Oral QHS   multivitamin with minerals  1 tablet Oral Daily   raloxifene  60 mg Oral Daily   ramipril  5 mg Oral Daily   vitamin B-12  1,000 mcg Oral Daily   Continuous Infusions:  lactated ringers 75 mL/hr at 11/17/21 0518   PRN Meds:.acetaminophen **OR** acetaminophen, hydrALAZINE  Antibiotics  :    Anti-infectives (From admission, onward)    None        Phillips Climes M.D on 11/17/2021 at 3:39 PM  To page go to [email or web address]   [facility] -  Office  [phone]  See all Orders from today for further details    Objective:   Vitals:   11/17/21 0029 11/17/21 0518 11/17/21 0519 11/17/21 0802  BP: 129/65 (!) 164/88 (!) 168/59 (!) 136/58  Pulse: 73  87 65  Resp: '15  16 16  '$ Temp: 98.5 F (36.9 C)  98.7 F (37.1 C) 97.7 F (36.5 C)  TempSrc: Oral  Oral Oral  SpO2: 98%  99% 99%  Weight:      Height:        Wt Readings from Last 3 Encounters:  11/15/21 55 kg  05/20/21 50.3 kg  11/12/20 52.3 kg     Intake/Output Summary (Last 24 hours) at 11/17/2021 1539 Last data filed at 11/17/2021 5284 Gross per 24 hour  Intake 431.33 ml  Output 300 ml  Net 131.33 ml  Physical Exam   Awake, pleasant, confused, significantly impaired cognition and insight Symmetrical Chest wall movement, Good air movement bilaterally, CTAB RRR,No Gallops,Rubs or new Murmurs, No Parasternal Heave +ve B.Sounds, Abd Soft, No tenderness, No rebound - guarding or rigidity. No Cyanosis, Clubbing or edema, No new Rash or bruise      Data Review:    CBC Recent Labs  Lab 11/13/21 1247 11/14/21 0405  WBC 9.7 9.3  HGB 15.4* 12.5  HCT 46.3* 37.8  PLT 377 323  MCV 91.3 91.5  MCH 30.4 30.3  MCHC 33.3 33.1  RDW 13.8 13.9    Electrolytes Recent Labs  Lab 11/13/21 1247 11/13/21 1605 11/14/21 0405  NA 139  --  138  K 4.0  --   3.6  CL 104  --  108  CO2 23  --  23  GLUCOSE 119*  --  100*  BUN 9  --  12  CREATININE 0.90  --  0.91  CALCIUM 9.3  --  8.4*  AST 25  --   --   ALT 14  --   --   ALKPHOS 79  --   --   BILITOT 0.9  --   --   ALBUMIN 3.7  --   --   MG  --   --  1.8  TSH  --  0.739  --   AMMONIA  --  23  --     No results for input(s): "TSH", "T4TOTAL", "T3FREE", "THYROIDAB" in the last 72 hours.  Invalid input(s): "FREET3"     Radiology Reports EEG adult  Result Date: 11/14/2021 Lora Havens, MD     11/14/2021  8:29 AM Patient Name: Denise Olson MRN: [id number] Epilepsy Attending: Lora Havens Referring Physician/Provider: Orma Flaming, MD Date: 11/13/2021 Duration: 21.42 mins Patient history: 76 y.o. female with PMH significant for alzheimers dementia who presents with progressived decline in ability to do ADLs and failure to thrive. EEG to evaluate for seizure. Level of alertness: Awake AEDs during EEG study: None Technical aspects: This EEG study was done with scalp electrodes positioned according to the 10-20 International system of electrode placement. Electrical activity was acquired at a sampling rate of '500Hz'$  and reviewed with a high frequency filter of '70Hz'$  and a low frequency filter of '1Hz'$ . EEG data were recorded continuously and digitally stored. Description: The posterior dominant rhythm consists of 8-9 Hz activity of moderate voltage (25-35 uV) seen predominantly in posterior head regions, symmetric and reactive to eye opening and eye closing. Hyperventilation and photic stimulation were not performed.   Of note, study was technically limited due to significant electrode and movement artifact. IMPRESSION: This technically difficult study is within normal limits. No seizures or epileptiform discharges were seen throughout the recording. Priyanka Barbra Sarks

## 2021-11-17 NOTE — TOC Progression Note (Signed)
Transition of Care [facility]) - Progression Note    Patient Details  Name: Denise Olson MRN: [id number] Date of Birth: [birth year]  Transition of [facility]) CM/SW [place], LCSW Phone Number: 11/17/2021, 2:40 PM  Clinical Narrative:    CSW met with patient's spouse at bedside and provided SNF bed offers. He is going to drive by [facility] and [facility]. CSW also provided ALF/memory care list for after rehab if needed. He is hopeful that patient can regain strength in her legs to be able to come home.    Expected Discharge Plan: [place] Barriers to Discharge: Continued Medical Work up  Expected Discharge Plan and Services Expected Discharge Plan: [place] arrangements for the past 2 months: Single Family Home                                       Social Determinants of Health (SDOH) Interventions    Readmission Risk Interventions     No data to display

## 2021-11-18 DIAGNOSIS — Z515 Encounter for palliative care: Secondary | ICD-10-CM | POA: Diagnosis not present

## 2021-11-18 DIAGNOSIS — G301 Alzheimer's disease with late onset: Secondary | ICD-10-CM | POA: Diagnosis not present

## 2021-11-18 DIAGNOSIS — M2550 Pain in unspecified joint: Secondary | ICD-10-CM | POA: Diagnosis not present

## 2021-11-18 DIAGNOSIS — M810 Age-related osteoporosis without current pathological fracture: Secondary | ICD-10-CM | POA: Diagnosis not present

## 2021-11-18 DIAGNOSIS — I10 Essential (primary) hypertension: Secondary | ICD-10-CM | POA: Diagnosis present

## 2021-11-18 DIAGNOSIS — R634 Abnormal weight loss: Secondary | ICD-10-CM | POA: Diagnosis not present

## 2021-11-18 DIAGNOSIS — B351 Tinea unguium: Secondary | ICD-10-CM | POA: Diagnosis present

## 2021-11-18 DIAGNOSIS — E43 Unspecified severe protein-calorie malnutrition: Secondary | ICD-10-CM | POA: Diagnosis not present

## 2021-11-18 DIAGNOSIS — R0602 Shortness of breath: Secondary | ICD-10-CM | POA: Diagnosis not present

## 2021-11-18 DIAGNOSIS — R778 Other specified abnormalities of plasma proteins: Secondary | ICD-10-CM | POA: Diagnosis present

## 2021-11-18 DIAGNOSIS — R41841 Cognitive communication deficit: Secondary | ICD-10-CM | POA: Diagnosis present

## 2021-11-18 DIAGNOSIS — R5383 Other fatigue: Secondary | ICD-10-CM | POA: Diagnosis not present

## 2021-11-18 DIAGNOSIS — D531 Other megaloblastic anemias, not elsewhere classified: Secondary | ICD-10-CM | POA: Diagnosis not present

## 2021-11-18 DIAGNOSIS — G309 Alzheimer's disease, unspecified: Secondary | ICD-10-CM | POA: Diagnosis present

## 2021-11-18 DIAGNOSIS — M6281 Muscle weakness (generalized): Secondary | ICD-10-CM | POA: Diagnosis present

## 2021-11-18 DIAGNOSIS — R1310 Dysphagia, unspecified: Secondary | ICD-10-CM | POA: Diagnosis not present

## 2021-11-18 DIAGNOSIS — R2681 Unsteadiness on feet: Secondary | ICD-10-CM | POA: Diagnosis present

## 2021-11-18 DIAGNOSIS — R1312 Dysphagia, oropharyngeal phase: Secondary | ICD-10-CM | POA: Diagnosis present

## 2021-11-18 DIAGNOSIS — F0280 Dementia in other diseases classified elsewhere without behavioral disturbance: Secondary | ICD-10-CM | POA: Diagnosis not present

## 2021-11-18 DIAGNOSIS — R32 Unspecified urinary incontinence: Secondary | ICD-10-CM | POA: Diagnosis not present

## 2021-11-18 DIAGNOSIS — F17200 Nicotine dependence, unspecified, uncomplicated: Secondary | ICD-10-CM | POA: Diagnosis present

## 2021-11-18 DIAGNOSIS — U071 COVID-19: Secondary | ICD-10-CM | POA: Diagnosis not present

## 2021-11-18 DIAGNOSIS — G9340 Encephalopathy, unspecified: Secondary | ICD-10-CM | POA: Diagnosis present

## 2021-11-18 DIAGNOSIS — Z853 Personal history of malignant neoplasm of breast: Secondary | ICD-10-CM | POA: Diagnosis not present

## 2021-11-18 DIAGNOSIS — Z7401 Bed confinement status: Secondary | ICD-10-CM | POA: Diagnosis not present

## 2021-11-18 DIAGNOSIS — R159 Full incontinence of feces: Secondary | ICD-10-CM | POA: Diagnosis not present

## 2021-11-18 DIAGNOSIS — R627 Adult failure to thrive: Secondary | ICD-10-CM | POA: Diagnosis not present

## 2021-11-18 DIAGNOSIS — E785 Hyperlipidemia, unspecified: Secondary | ICD-10-CM | POA: Diagnosis not present

## 2021-11-18 DIAGNOSIS — J449 Chronic obstructive pulmonary disease, unspecified: Secondary | ICD-10-CM | POA: Diagnosis not present

## 2021-11-18 DIAGNOSIS — E782 Mixed hyperlipidemia: Secondary | ICD-10-CM | POA: Diagnosis present

## 2021-11-18 MED ORDER — MEMANTINE HCL 5 MG PO TABS: 5.0000 mg | ORAL_TABLET | Freq: Every day | ORAL | Status: AC

## 2021-11-18 MED ORDER — ADULT MULTIVITAMIN W/MINERALS CH: 1.0000 | ORAL_TABLET | Freq: Every day | ORAL | Status: AC

## 2021-11-18 MED ORDER — FOLIC ACID 1 MG PO TABS: 1.0000 mg | ORAL_TABLET | Freq: Every day | ORAL | Status: AC

## 2021-11-18 MED ORDER — CYANOCOBALAMIN 1000 MCG PO TABS: 1000.0000 ug | ORAL_TABLET | Freq: Every day | ORAL | Status: AC

## 2021-11-18 MED ORDER — ACETAMINOPHEN 325 MG PO TABS: 650.0000 mg | ORAL_TABLET | Freq: Four times a day (QID) | ORAL | Status: AC | PRN

## 2021-11-18 NOTE — Progress Notes (Signed)
Discharge paperwork reviewed with client by the bedside. No complaints of pain have been made at this time. Multiple attempts have been made to give report to [facility], no response has been made.

## 2021-11-18 NOTE — TOC Progression Note (Addendum)
Transition of Care [facility]) - Progression Note    Patient Details  Name: Denise Olson MRN: [id number] Date of Birth: 11-Sep-1945  Transition of Care [facility]) CM/SW [place], LCSW Phone Number: 11/18/2021, 11:20 AM  Clinical Narrative:    11:20am-CSW attempted to reach spouse by phone, no answer, no voicemail. Not in patient's room. CSW emailed him to obtain SNF choice.  12pm-CSW met with spouse in room. He is agreeable to [facility]. CSW left message for admissions to confirm bed for today. Spouse requesting [facility] for transport due to patient's confusion.   12:22pm-[facility] able to accept patient today.    Expected Discharge Plan: [place] Barriers to Discharge: Continued Medical Work up  Expected Discharge Plan and Services Expected Discharge Plan: [place] arrangements for the past 2 months: Single Family Home                                       Social Determinants of Health (SDOH) Interventions    Readmission Risk Interventions     No data to display

## 2021-11-18 NOTE — Discharge Summary (Signed)
Physician Discharge Summary  Denise Olson ASN:[id number] DOB: 1945-11-05 DOA: 11/13/2021  PCP: [name], MD  Admit date: 11/13/2021 Discharge date: 11/18/2021  Admitted From: Home Disposition:  SNF  Recommendations for Outpatient Follow-up:  Please obtain BMP/CBC in one week Please check L93, folic acid in 4 to 6 weeks Please continue with X90 and folic acid supplement upon discharge from SNF   Discharge Condition:Stable CODE STATUS:FULL Diet recommendation: Regular  Brief/Interim Summary:   76 y.o. female with medical history significant of breast cancer, Alzheimer's, COPD, HTN, HLD, nicotine dependence who presented to ED with AMS. She has had progressive decline over the past 3-4 weeks including incontinence, inability to ambulate on own, balance issues, fatigue and sleeping in chair.  According to the husband she was diagnosed with Alzheimer's dementia about 1 year ago but for the last few months she has been showing progressive decline, she has been hallucinating at times, getting more confused, on Thursday she laid on the floor and the husband could not get her up for a day, she was brought into the hospital seen by neurology and admitted for further work up.    Acute metabolic encephalopathy on top of underlying advanced Alzheimer's dementia worsened with dehydration.  - She has been progressively declining over the last 3 months, now has been unable to care for herself, she laid on the floor for over 24 hours.  Seen by neurology.  CT head and EEG nonacute, B12 is borderline low, low folate and replacement will be initiated, HIV and RPR negative TSH is normal. Continue supportive care with IV fluids, PT OT, will require SNF - will DC to SNF .   Alzheimer's disease ([name]) -  Progressive decline, seems rapid to justify progressing dementia, but could be playing a part.  Per neurology she has been placed on combination of Aricept, Remeron and memantine, at risk for delirium  minimize benzodiazepines and narcotics. Continue to monitor with supportive care as above.   Elevated troponin - Troponin mildly bumped and flat. She denies any chest pain, no findings on EKG, Not consistent with ACS.  On aspirin and statin.  Continue.  Not a candidate for invasive testing or procedures.   Essential hypertension - Continue altace '5mg'$  daily    Mixed hyperlipidemia -  Continue lipitor daily    History of breast cancer - Continue evista    Nicotine dependence - Per husband only smokes a few cigarettes a day and has not really been interested in last few days Declines nicotine patch at this time, counseled to quit.  Discharge Diagnoses:  Principal Problem:   Acute encephalopathy Active Problems:   Alzheimer's disease ([place])   Elevated troponin   Essential hypertension   Mixed hyperlipidemia   History of breast cancer   Nicotine dependence    Discharge Instructions  Discharge Instructions     Diet - low sodium heart healthy   Complete by: As directed    Discharge instructions   Complete by: As directed    Follow with Primary MD [name], MD /SNF physician  Get CBC, CMP,  checked  by Primary MD next visit.    Activity: As tolerated with Full fall precautions use walker/cane & assistance as needed   Disposition SNF   Diet: Regular diet   On your next visit with your primary care physician please Get Medicines reviewed and adjusted.   Please request your Prim.MD to go over all Hospital Tests and Procedure/Radiological results at the follow up, please get all  Hospital records sent to your Prim MD by signing hospital release before you go home.   If you experience worsening of your admission symptoms, develop shortness of breath, life threatening emergency, suicidal or homicidal thoughts you must seek medical attention immediately by calling 911 or calling your MD immediately  if symptoms less severe.  You Must read complete instructions/literature  along with all the possible adverse reactions/side effects for all the Medicines you take and that have been prescribed to you. Take any new Medicines after you have completely understood and accpet all the possible adverse reactions/side effects.   Do not drive, operating heavy machinery, perform activities at heights, swimming or participation in water activities or provide baby sitting services if your were admitted for syncope or siezures until you have seen by Primary MD or a Neurologist and advised to do so again.  Do not drive when taking Pain medications.    Do not take more than prescribed Pain, Sleep and Anxiety Medications  Special Instructions: If you have smoked or chewed Tobacco  in the last 2 yrs please stop smoking, stop any regular Alcohol  and or any Recreational drug use.  Wear Seat belts while driving.   Please note  You were cared for by a hospitalist during your hospital stay. If you have any questions about your discharge medications or the care you received while you were in the hospital after you are discharged, you can call the unit and asked to speak with the hospitalist on call if the hospitalist that took care of you is not available. Once you are discharged, your primary care physician will handle any further medical issues. Please note that NO REFILLS for any discharge medications will be authorized once you are discharged, as it is imperative that you return to your primary care physician (or establish a relationship with a primary care physician if you do not have one) for your aftercare needs so that they can reassess your need for medications and monitor your lab values. Follow with Primary MD [name], MD in 7 days   Get CBC, CMP, 2 view Chest X ray checked  by Primary MD next visit.    Activity: As tolerated with Full fall precautions use walker/cane & assistance as needed   Disposition Home **   Diet: Heart Healthy ** , with feeding assistance  and aspiration precautions.  For Heart failure patients - Check your Weight same time everyday, if you gain over 2 pounds, or you develop in leg swelling, experience more shortness of breath or chest pain, call your Primary MD immediately. Follow Cardiac Low Salt Diet and 1.5 lit/day fluid restriction.   On your next visit with your primary care physician please Get Medicines reviewed and adjusted.   Please request your Prim.MD to go over all Hospital Tests and Procedure/Radiological results at the follow up, please get all Hospital records sent to your Prim MD by signing hospital release before you go home.   If you experience worsening of your admission symptoms, develop shortness of breath, life threatening emergency, suicidal or homicidal thoughts you must seek medical attention immediately by calling 911 or calling your MD immediately  if symptoms less severe.  You Must read complete instructions/literature along with all the possible adverse reactions/side effects for all the Medicines you take and that have been prescribed to you. Take any new Medicines after you have completely understood and accpet all the possible adverse reactions/side effects.   Do not drive, operating heavy machinery,  perform activities at heights, swimming or participation in water activities or provide baby sitting services if your were admitted for syncope or siezures until you have seen by Primary MD or a Neurologist and advised to do so again.  Do not drive when taking Pain medications.    Do not take more than prescribed Pain, Sleep and Anxiety Medications  Special Instructions: If you have smoked or chewed Tobacco  in the last 2 yrs please stop smoking, stop any regular Alcohol  and or any Recreational drug use.  Wear Seat belts while driving.   Please note  You were cared for by a hospitalist during your hospital stay. If you have any questions about your discharge medications or the care you  received while you were in the hospital after you are discharged, you can call the unit and asked to speak with the hospitalist on call if the hospitalist that took care of you is not available. Once you are discharged, your primary care physician will handle any further medical issues. Please note that NO REFILLS for any discharge medications will be authorized once you are discharged, as it is imperative that you return to your primary care physician (or establish a relationship with a primary care physician if you do not have one) for your aftercare needs so that they can reassess your need for medications and monitor your lab values.   Increase activity slowly   Complete by: As directed       Allergies as of 11/18/2021       Reactions   Codeine Other (See Comments)   Hydrocodone-ibuprofen Other (See Comments)   Penicillins Other (See Comments)   Unsure    Latex Rash        Medication List     TAKE these medications    acetaminophen 325 MG tablet Commonly known as: TYLENOL Take 2 tablets (650 mg total) by mouth every 6 (six) hours as needed for mild pain (or Fever >/= 101).   aspirin 81 MG chewable tablet Chew 81 mg by mouth daily.   atorvastatin 40 MG tablet Commonly known as: LIPITOR Take 1 tablet by mouth daily.   CALCIUM 1200 PO Take 1,200 mg by mouth daily.   cyanocobalamin 1000 MCG tablet Take 1 tablet (1,000 mcg total) by mouth daily. Start taking on: November 19, 2021   donepezil 10 MG tablet Commonly known as: ARICEPT Take one tablet daily What changed:  how much to take how to take this when to take this additional instructions   folic acid 1 MG tablet Commonly known as: FOLVITE Take 1 tablet (1 mg total) by mouth daily. Start taking on: November 19, 2021   mirtazapine 7.5 MG tablet Commonly known as: REMERON Take 7.5 mg by mouth at bedtime.   multivitamin with minerals Tabs tablet Take 1 tablet by mouth daily. Start taking on: November 19, 2021    raloxifene 60 MG tablet Commonly known as: EVISTA Take 60 mg by mouth daily.   ramipril 5 MG capsule Commonly known as: ALTACE Take 5 mg by mouth daily.        Contact information for after-discharge care     Destination     [facility] .   Service: Skilled Nursing Contact information: [address] [address] [phone]                    Allergies  Allergen Reactions   Codeine Other (See Comments)  Hydrocodone-Ibuprofen Other (See Comments)   Penicillins Other (See Comments)    Unsure    Latex Rash    Consultations: Neurology   Procedures/Studies: EEG adult  Result Date: 11-21-2021 Lora Havens, MD     2021/11/21  8:29 AM Patient Name: Denise Olson MRN: [id number] Epilepsy Attending: Lora Havens Referring Physician/Provider: Orma Flaming, MD Date: 11/13/2021 Duration: 21.42 mins Patient history: 76 y.o. female with PMH significant for alzheimers dementia who presents with progressived decline in ability to do ADLs and failure to thrive. EEG to evaluate for seizure. Level of alertness: Awake AEDs during EEG study: None Technical aspects: This EEG study was done with scalp electrodes positioned according to the 10-20 International system of electrode placement. Electrical activity was acquired at a sampling rate of '500Hz'$  and reviewed with a high frequency filter of '70Hz'$  and a low frequency filter of '1Hz'$ . EEG data were recorded continuously and digitally stored. Description: The posterior dominant rhythm consists of 8-9 Hz activity of moderate voltage (25-35 uV) seen predominantly in posterior head regions, symmetric and reactive to eye opening and eye closing. Hyperventilation and photic stimulation were not performed.   Of note, study was technically limited due to significant electrode and movement artifact. IMPRESSION: This technically difficult study is within normal limits. No seizures or  epileptiform discharges were seen throughout the recording. Lora Havens   CT Head Wo Contrast  Result Date: 11/13/2021 CLINICAL DATA:  Patient slid out of her chair. Found on her back. Altered mental status. EXAM: CT HEAD WITHOUT CONTRAST TECHNIQUE: Contiguous axial images were obtained from the base of the skull through the vertex without intravenous contrast. RADIATION DOSE REDUCTION: This exam was performed according to the departmental dose-optimization program which includes automated exposure control, adjustment of the mA and/or kV according to patient size and/or use of iterative reconstruction technique. COMPARISON:  01/28/2013. FINDINGS: Brain: No evidence of acute infarction, hemorrhage, hydrocephalus, extra-axial collection or mass lesion/mass effect. There is ventricular greater than sulcal enlargement consistent with mild diffuse atrophy, which has significantly increased when compared to the prior head CT. Patchy areas of periventricular white matter hypoattenuation are also noted consistent with mild chronic microvascular ischemic change. Vascular: No hyperdense vessel or unexpected calcification. Skull: Normal. Negative for fracture or focal lesion. Sinuses/Orbits: Globes and orbits are unremarkable. The visualized sinuses are clear. Other: None. IMPRESSION: 1. No definite acute intracranial abnormality. 2. There is ventricular enlargement greater than sulcal enlargement. Pattern of ventricular enlargement raises the possibility of communicating hydrocephalus. Both the ventricular and sulcal enlargement is new compared to the CT from 01/28/2013. Electronically Signed   By: Lajean Manes M.D.   On: 11/13/2021 14:21   DG Chest Portable 1 View  Result Date: 11/13/2021 CLINICAL DATA:  Fall, pain EXAM: PORTABLE CHEST 1 VIEW COMPARISON:  08/18/2011 FINDINGS: Cardiomegaly. Both lungs are clear. Chronic fracture deformity of the proximal right humerus. 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 IMPRESSION: Cardiomegaly. No acute abnormality of the lungs in AP portable projection. Electronically Signed   By: Delanna Ahmadi M.D.   On: 11/13/2021 13:48   DG Pelvis Portable  Result Date: 11/13/2021 CLINICAL DATA:  Trauma, fall, pain EXAM: PORTABLE PELVIS 1-2 VIEWS COMPARISON:  None Available. FINDINGS: No displaced fracture or dislocation is seen. Degenerative changes are noted in lower lumbar spine and both SI joints. IMPRESSION: No recent fracture or dislocation is seen in pelvis. Electronically Signed   By: Elmer Picker M.D.   On: 11/13/2021 13:48  DG Shoulder Right  Result Date: 11/13/2021 CLINICAL DATA:  Fall, pain EXAM: RIGHT SHOULDER - 2+ VIEW COMPARISON:  03/02/2015 FINDINGS: There is no evidence of acute fracture or dislocation. Chronic fracture deformity of the proximal right humerus, seen acutely on examination dated 03/02/2015. Mild acromioclavicular and glenohumeral arthrosis. Soft tissues are unremarkable. IMPRESSION: 1.  No acute fracture or dislocation of the right shoulder. 2. Chronic fracture deformity of the proximal right humerus, seen acutely on examination dated 03/02/2015. Electronically Signed   By: Delanna Ahmadi M.D.   On: 11/13/2021 13:47      Subjective:  She denies any complaints today, no significant events overnight.  Discharge Exam: Vitals:   11/18/21 0814 11/18/21 1229  BP: 109/66 (!) 94/43  Pulse: 62 66  Resp: 16 20  Temp: 97.9 F (36.6 C) 97.6 F (36.4 C)  SpO2: 95% 93%   Vitals:   11/17/21 2314 11/18/21 0429 11/18/21 0814 11/18/21 1229  BP: 122/72 (!) 156/53 109/66 (!) 94/43  Pulse: 73 62 62 66  Resp: '19 19 16 20  '$ Temp: 97.9 F (36.6 C) 98.1 F (36.7 C) 97.9 F (36.6 C) 97.6 F (36.4 C)  TempSrc: Oral Oral Oral Oral  SpO2: 98% 97% 95% 93%  Weight:      Height:        General: Pt is awake, pleasant, demented, impaired cognition and insight Cardiovascular: RRR, S1/S2 +, no rubs, no gallops Respiratory: CTA  bilaterally, no wheezing, no rhonchi Abdominal: Soft, NT, ND, bowel sounds + Extremities: no edema, no cyanosis    The results of significant diagnostics from this hospitalization (including imaging, microbiology, ancillary and laboratory) are listed below for reference.     Microbiology: Recent Results (from the past 240 hour(s))  Urine Culture     Status: Abnormal   Collection Time: 11/13/21  1:08 PM   Specimen: Urine, Clean Catch  Result Value Ref Range Status   Specimen Description URINE, CLEAN CATCH  Final   Special Requests   Final    NONE Performed at [facility], [address], [place], [place] [address]    Culture MULTIPLE SPECIES PRESENT, SUGGEST RECOLLECTION (A)  Final   Report Status 11/15/2021 FINAL  Final  Resp Panel by RT-PCR (Flu A&B, Covid) Anterior Nasal Swab     Status: None   Collection Time: 11/14/21  1:18 AM   Specimen: Anterior Nasal Swab  Result Value Ref Range Status   SARS Coronavirus 2 by RT PCR NEGATIVE NEGATIVE Final    Comment: (NOTE) SARS-CoV-2 target nucleic acids are NOT DETECTED.  The SARS-CoV-2 RNA is generally detectable in upper respiratory specimens during the acute phase of infection. The lowest concentration of SARS-CoV-2 viral copies this assay can detect is 138 copies/mL. A negative result does not preclude SARS-Cov-2 infection and should not be used as the sole basis for treatment or other patient management decisions. A negative result may occur with  improper specimen collection/handling, submission of specimen other than nasopharyngeal swab, presence of viral mutation(s) within the areas targeted by this assay, and inadequate number of viral copies(<138 copies/mL). A negative result must be combined with clinical observations, patient history, and epidemiological information. The expected result is Negative.  Fact Sheet for Patients:  EntrepreneurPulse.com.au  Fact Sheet for Healthcare Providers:   IncredibleEmployment.be  This test is no t yet approved or cleared by the [place] FDA and  has been authorized for detection and/or diagnosis of SARS-CoV-2 by FDA under an Emergency Use Authorization (EUA). This  EUA will remain  in effect (meaning this test can be used) for the duration of the COVID-19 declaration under Section 564(b)(1) of the Act, 21 U.S.C.section 360bbb-3(b)(1), unless the authorization is terminated  or revoked sooner.       Influenza A by PCR NEGATIVE NEGATIVE Final   Influenza B by PCR NEGATIVE NEGATIVE Final    Comment: (NOTE) The Xpert Xpress SARS-CoV-2/FLU/RSV plus assay is intended as an aid in the diagnosis of influenza from Nasopharyngeal swab specimens and should not be used as a sole basis for treatment. Nasal washings and aspirates are unacceptable for Xpert Xpress SARS-CoV-2/FLU/RSV testing.  Fact Sheet for Patients: EntrepreneurPulse.com.au  Fact Sheet for Healthcare Providers: IncredibleEmployment.be  This test is not yet approved or cleared by the [place] FDA and has been authorized for detection and/or diagnosis of SARS-CoV-2 by FDA under an Emergency Use Authorization (EUA). This EUA will remain in effect (meaning this test can be used) for the duration of the COVID-19 declaration under Section 564(b)(1) of the Act, 21 U.S.C. section 360bbb-3(b)(1), unless the authorization is terminated or revoked.  Performed at [facility], [place] [address] [place], [place] [address]      Labs: BNP (last 3 results) No results for input(s): "BNP" in the last 8760 hours. Basic Metabolic Panel: Recent Labs  Lab 11/13/21 1247 11/14/21 0405  NA 139 138  K 4.0 3.6  CL 104 108  CO2 23 23  GLUCOSE 119* 100*  BUN 9 12  CREATININE 0.90 0.91  CALCIUM 9.3 8.4*  MG  --  1.8   Liver Function Tests: Recent Labs  Lab 11/13/21 1247  AST 25  ALT 14  ALKPHOS 79  BILITOT 0.9   PROT 7.5  ALBUMIN 3.7   No results for input(s): "LIPASE", "AMYLASE" in the last 168 hours. Recent Labs  Lab 11/13/21 1605  AMMONIA 23   CBC: Recent Labs  Lab 11/13/21 1247 11/14/21 0405  WBC 9.7 9.3  HGB 15.4* 12.5  HCT 46.3* 37.8  MCV 91.3 91.5  PLT 377 323   Cardiac Enzymes: Recent Labs  Lab 11/13/21 1247  CKTOTAL 119   BNP: Invalid input(s): "POCBNP" CBG: No results for input(s): "GLUCAP" in the last 168 hours. D-Dimer No results for input(s): "DDIMER" in the last 72 hours. Hgb A1c No results for input(s): "HGBA1C" in the last 72 hours. Lipid Profile No results for input(s): "CHOL", "HDL", "LDLCALC", "TRIG", "CHOLHDL", "LDLDIRECT" in the last 72 hours. Thyroid function studies No results for input(s): "TSH", "T4TOTAL", "T3FREE", "THYROIDAB" in the last 72 hours.  Invalid input(s): "FREET3" Anemia work up No results for input(s): "VITAMINB12", "FOLATE", "FERRITIN", "TIBC", "IRON", "RETICCTPCT" in the last 72 hours. Urinalysis    Component Value Date/Time   COLORURINE YELLOW 11/13/2021 1308   APPEARANCEUR CLEAR 11/13/2021 1308   LABSPEC 1.020 11/13/2021 1308   PHURINE 5.0 11/13/2021 1308   GLUCOSEU NEGATIVE 11/13/2021 1308   HGBUR NEGATIVE 11/13/2021 1308   BILIRUBINUR NEGATIVE 11/13/2021 1308   KETONESUR 20 (A) 11/13/2021 1308   PROTEINUR 30 (A) 11/13/2021 1308   NITRITE NEGATIVE 11/13/2021 1308   LEUKOCYTESUR TRACE (A) 11/13/2021 1308   Sepsis Labs Recent Labs  Lab 11/13/21 1247 11/14/21 0405  WBC 9.7 9.3   Microbiology Recent Results (from the past 240 hour(s))  Urine Culture     Status: Abnormal   Collection Time: 11/13/21  1:08 PM   Specimen: Urine, Clean Catch  Result Value Ref Range Status   Specimen Description URINE, CLEAN CATCH  Final   Special Requests   Final    NONE Performed at [facility], [place] [address] [place], [place] [address]    Culture MULTIPLE SPECIES PRESENT, SUGGEST RECOLLECTION (A)  Final   Report Status  11/15/2021 FINAL  Final  Resp Panel by RT-PCR (Flu A&B, Covid) Anterior Nasal Swab     Status: None   Collection Time: 11/14/21  1:18 AM   Specimen: Anterior Nasal Swab  Result Value Ref Range Status   SARS Coronavirus 2 by RT PCR NEGATIVE NEGATIVE Final    Comment: (NOTE) SARS-CoV-2 target nucleic acids are NOT DETECTED.  The SARS-CoV-2 RNA is generally detectable in upper respiratory specimens during the acute phase of infection. The lowest concentration of SARS-CoV-2 viral copies this assay can detect is 138 copies/mL. A negative result does not preclude SARS-Cov-2 infection and should not be used as the sole basis for treatment or other patient management decisions. A negative result may occur with  improper specimen collection/handling, submission of specimen other than nasopharyngeal swab, presence of viral mutation(s) within the areas targeted by this assay, and inadequate number of viral copies(<138 copies/mL). A negative result must be combined with clinical observations, patient history, and epidemiological information. The expected result is Negative.  Fact Sheet for Patients:  EntrepreneurPulse.com.au  Fact Sheet for Healthcare Providers:  IncredibleEmployment.be  This test is no t yet approved or cleared by the [place] FDA and  has been authorized for detection and/or diagnosis of SARS-CoV-2 by FDA under an Emergency Use Authorization (EUA). This EUA will remain  in effect (meaning this test can be used) for the duration of the COVID-19 declaration under Section 564(b)(1) of the Act, 21 U.S.C.section 360bbb-3(b)(1), unless the authorization is terminated  or revoked sooner.       Influenza A by PCR NEGATIVE NEGATIVE Final   Influenza B by PCR NEGATIVE NEGATIVE Final    Comment: (NOTE) The Xpert Xpress SARS-CoV-2/FLU/RSV plus assay is intended as an aid in the diagnosis of influenza from Nasopharyngeal swab specimens  and should not be used as a sole basis for treatment. Nasal washings and aspirates are unacceptable for Xpert Xpress SARS-CoV-2/FLU/RSV testing.  Fact Sheet for Patients: EntrepreneurPulse.com.au  Fact Sheet for Healthcare Providers: IncredibleEmployment.be  This test is not yet approved or cleared by the [place] FDA and has been authorized for detection and/or diagnosis of SARS-CoV-2 by FDA under an Emergency Use Authorization (EUA). This EUA will remain in effect (meaning this test can be used) for the duration of the COVID-19 declaration under Section 564(b)(1) of the Act, 21 U.S.C. section 360bbb-3(b)(1), unless the authorization is terminated or revoked.  Performed at [facility], [place] [address] [place], [place] [address]      Time coordinating discharge: Over 30 minutes  SIGNED:   Phillips Climes, MD  [facility] 11/18/2021, 1:23 PM Pager   If 7PM-7AM, please contact night-coverage [email or web address] Password [email or web address]

## 2021-11-18 NOTE — Discharge Instructions (Signed)
Follow with Primary MD [name], MD /SNF physician  Get CBC, CMP,  checked  by Primary MD next visit.    Activity: As tolerated with Full fall precautions use walker/cane & assistance as needed   Disposition SNF   Diet: Regular diet   On your next visit with your primary care physician please Get Medicines reviewed and adjusted.   Please request your Prim.MD to go over all Hospital Tests and Procedure/Radiological results at the follow up, please get all Hospital records sent to your Prim MD by signing hospital release before you go home.   If you experience worsening of your admission symptoms, develop shortness of breath, life threatening emergency, suicidal or homicidal thoughts you must seek medical attention immediately by calling 911 or calling your MD immediately  if symptoms less severe.  You Must read complete instructions/literature along with all the possible adverse reactions/side effects for all the Medicines you take and that have been prescribed to you. Take any new Medicines after you have completely understood and accpet all the possible adverse reactions/side effects.   Do not drive, operating heavy machinery, perform activities at heights, swimming or participation in water activities or provide baby sitting services if your were admitted for syncope or siezures until you have seen by Primary MD or a Neurologist and advised to do so again.  Do not drive when taking Pain medications.    Do not take more than prescribed Pain, Sleep and Anxiety Medications  Special Instructions: If you have smoked or chewed Tobacco  in the last 2 yrs please stop smoking, stop any regular Alcohol  and or any Recreational drug use.  Wear Seat belts while driving.   Please note  You were cared for by a hospitalist during your hospital stay. If you have any questions about your discharge medications or the care you received while you were in the hospital after you are  discharged, you can call the unit and asked to speak with the hospitalist on call if the hospitalist that took care of you is not available. Once you are discharged, your primary care physician will handle any further medical issues. Please note that NO REFILLS for any discharge medications will be authorized once you are discharged, as it is imperative that you return to your primary care physician (or establish a relationship with a primary care physician if you do not have one) for your aftercare needs so that they can reassess your need for medications and monitor your lab values. Follow with Primary MD [name], MD in 7 days   Get CBC, CMP, 2 view Chest X ray checked  by Primary MD next visit.    Activity: As tolerated with Full fall precautions use walker/cane & assistance as needed   Disposition Home **   Diet: Heart Healthy ** , with feeding assistance and aspiration precautions.  For Heart failure patients - Check your Weight same time everyday, if you gain over 2 pounds, or you develop in leg swelling, experience more shortness of breath or chest pain, call your Primary MD immediately. Follow Cardiac Low Salt Diet and 1.5 lit/day fluid restriction.   On your next visit with your primary care physician please Get Medicines reviewed and adjusted.   Please request your Prim.MD to go over all Hospital Tests and Procedure/Radiological results at the follow up, please get all Hospital records sent to your Prim MD by signing hospital release before you go home.   If you experience worsening of your  admission symptoms, develop shortness of breath, life threatening emergency, suicidal or homicidal thoughts you must seek medical attention immediately by calling 911 or calling your MD immediately  if symptoms less severe.  You Must read complete instructions/literature along with all the possible adverse reactions/side effects for all the Medicines you take and that have been prescribed  to you. Take any new Medicines after you have completely understood and accpet all the possible adverse reactions/side effects.   Do not drive, operating heavy machinery, perform activities at heights, swimming or participation in water activities or provide baby sitting services if your were admitted for syncope or siezures until you have seen by Primary MD or a Neurologist and advised to do so again.  Do not drive when taking Pain medications.    Do not take more than prescribed Pain, Sleep and Anxiety Medications  Special Instructions: If you have smoked or chewed Tobacco  in the last 2 yrs please stop smoking, stop any regular Alcohol  and or any Recreational drug use.  Wear Seat belts while driving.   Please note  You were cared for by a hospitalist during your hospital stay. If you have any questions about your discharge medications or the care you received while you were in the hospital after you are discharged, you can call the unit and asked to speak with the hospitalist on call if the hospitalist that took care of you is not available. Once you are discharged, your primary care physician will handle any further medical issues. Please note that NO REFILLS for any discharge medications will be authorized once you are discharged, as it is imperative that you return to your primary care physician (or establish a relationship with a primary care physician if you do not have one) for your aftercare needs so that they can reassess your need for medications and monitor your lab values.

## 2021-11-18 NOTE — Plan of Care (Signed)
  Problem: Education: Goal: Knowledge of General Education information will improve Description: Including pain rating scale, medication(s)/side effects and non-pharmacologic comfort measures 11/18/2021 1332 by Vonna Kotyk, RN Outcome: Adequate for Discharge 11/18/2021 0747 by Vonna Kotyk, RN Outcome: Progressing   Problem: Health Behavior/Discharge Planning: Goal: Ability to manage health-related needs will improve 11/18/2021 1332 by Vonna Kotyk, RN Outcome: Adequate for Discharge 11/18/2021 0747 by Vonna Kotyk, RN Outcome: Progressing   Problem: Clinical Measurements: Goal: Ability to maintain clinical measurements within normal limits will improve 11/18/2021 1332 by Vonna Kotyk, RN Outcome: Adequate for Discharge 11/18/2021 0747 by Vonna Kotyk, RN Outcome: Progressing Goal: Will remain free from infection 11/18/2021 1332 by Vonna Kotyk, RN Outcome: Adequate for Discharge 11/18/2021 0747 by Vonna Kotyk, RN Outcome: Progressing Goal: Diagnostic test results will improve 11/18/2021 1332 by Vonna Kotyk, RN Outcome: Adequate for Discharge 11/18/2021 0747 by Vonna Kotyk, RN Outcome: Progressing Goal: Respiratory complications will improve 11/18/2021 1332 by Vonna Kotyk, RN Outcome: Adequate for Discharge 11/18/2021 0747 by Vonna Kotyk, RN Outcome: Progressing Goal: Cardiovascular complication will be avoided 11/18/2021 1332 by Vonna Kotyk, RN Outcome: Adequate for Discharge 11/18/2021 0747 by Vonna Kotyk, RN Outcome: Progressing   Problem: Activity: Goal: Risk for activity intolerance will decrease 11/18/2021 1332 by Vonna Kotyk, RN Outcome: Adequate for Discharge 11/18/2021 0747 by Vonna Kotyk, RN Outcome: Progressing   Problem: Nutrition: Goal: Adequate nutrition will be maintained 11/18/2021 1332 by Vonna Kotyk, RN Outcome: Adequate for Discharge 11/18/2021 0747 by Vonna Kotyk, RN Outcome: Progressing   Problem: Coping: Goal: Level of anxiety  will decrease 11/18/2021 1332 by Vonna Kotyk, RN Outcome: Adequate for Discharge 11/18/2021 0747 by Vonna Kotyk, RN Outcome: Progressing   Problem: Elimination: Goal: Will not experience complications related to bowel motility 11/18/2021 1332 by Vonna Kotyk, RN Outcome: Adequate for Discharge 11/18/2021 0747 by Vonna Kotyk, RN Outcome: Progressing Goal: Will not experience complications related to urinary retention 11/18/2021 1332 by Vonna Kotyk, RN Outcome: Adequate for Discharge 11/18/2021 0747 by Vonna Kotyk, RN Outcome: Progressing   Problem: Pain Managment: Goal: General experience of comfort will improve 11/18/2021 1332 by Vonna Kotyk, RN Outcome: Adequate for Discharge 11/18/2021 0747 by Vonna Kotyk, RN Outcome: Progressing   Problem: Safety: Goal: Ability to remain free from injury will improve 11/18/2021 1332 by Vonna Kotyk, RN Outcome: Adequate for Discharge 11/18/2021 0747 by Vonna Kotyk, RN Outcome: Progressing   Problem: Skin Integrity: Goal: Risk for impaired skin integrity will decrease 11/18/2021 1332 by Vonna Kotyk, RN Outcome: Adequate for Discharge 11/18/2021 0747 by Vonna Kotyk, RN Outcome: Progressing

## 2021-11-18 NOTE — Progress Notes (Signed)
Physical Therapy Treatment Patient Details Name: Denise Olson MRN: [id number] DOB: 01-01-1946 Today's Date: 11/18/2021   History of Present Illness 76 y.o. F admitted on 11/13/21 due to increased confusion, incontinence, and difficulty ambulating. PMH significant for breast cancer, Alzheimer's, COPD, HTN, HLD, nicotine dependence.    PT Comments    Pt curled up in bed, reports "A walk would be wonderful". Pt with difficulty sequencing movement towards EoB and requires minA for coming to EoB. Once EoB pt requires redirection to walk and is agreeable to go and get some warm blankets. Husband arrives and expresses hope that pt will be able to walk with RW. Pt however has no carryover of RW skills from prior treatment session and struggles with remembering sequencing of RW within session. Pt ultimately requires modA for steadying and management of RW. PT concurs with recommendation for SNF placement. Hopefully, that will happen this afternoon, otherwise PT will continue to follow acutely.   Recommendations for follow up therapy are one component of a multi-disciplinary discharge planning process, led by the attending physician.  Recommendations may be updated based on patient status, additional functional criteria and insurance authorization.  Follow Up Recommendations  Skilled nursing-short term rehab (<3 hours/day)     Assistance Recommended at Discharge Frequent or constant Supervision/Assistance  Patient can return home with the following A little help with walking and/or transfers;A little help with bathing/dressing/bathroom;Assistance with cooking/housework;Assistance with feeding;Direct supervision/assist for medications management;Direct supervision/assist for financial management;Assist for transportation;Help with stairs or ramp for entrance   Equipment Recommendations  Rolling walker (2 wheels);BSC/3in1       Precautions / Restrictions Precautions Precautions:  Fall Restrictions Weight Bearing Restrictions: No     Mobility  Bed Mobility Overal bed mobility: Needs Assistance Bed Mobility: Supine to Sit, Sit to Supine     Supine to sit: Min assist, HOB elevated Sit to supine: Min assist   General bed mobility comments: min hand over hand assist to get to EoB, difficulty sequencing events, min A for managment of LE back to bed    Transfers Overall transfer level: Needs assistance Equipment used: Rolling walker (2 wheels) Transfers: Sit to/from Stand Sit to Stand: Min assist           General transfer comment: increased cuing to come to standing but once aware of task at hand only requires min A for steadying in RW, decreased knowledge of RW use    Ambulation/Gait Ambulation/Gait assistance: Mod assist Gait Distance (Feet): 40 Feet Assistive device: Rolling walker (2 wheels) Gait Pattern/deviations: Step-through pattern, Step-to pattern, Decreased stride length Gait velocity: reduced Gait velocity interpretation: <1.31 ft/sec, indicative of household ambulator   General Gait Details: modA for management of pt in relationship to RW, no carry over for RW use and at times nearly tripping over it       Balance Overall balance assessment: Needs assistance Sitting-balance support: Feet supported Sitting balance-Leahy Scale: Fair     Standing balance support: Bilateral upper extremity supported, No upper extremity supported, During functional activity Standing balance-Leahy Scale: Fair Standing balance comment: pt is struggling with the sequencing of walker use, releases at times and then cannot resume use automatically                            Cognition Arousal/Alertness: Awake/alert Behavior During Therapy: WFL for tasks assessed/performed, Agitated Overall Cognitive Status: History of cognitive impairments - at baseline  General Comments: Alz, has moments of  aggression and verbalization of agitation           General Comments General comments (skin integrity, edema, etc.): Pt husband continues to be hopeful that pt can learn how to use RW, however limited carryover between sessions and even within session. CSW working with husband at end of session and finding increased assist for home if/when pt comes home      Pertinent Vitals/Pain Pain Assessment Pain Assessment: Faces Faces Pain Scale: Hurts little more Pain Location: general discomfort Pain Descriptors / Indicators: Grimacing, Guarding Pain Intervention(s): Limited activity within patient's tolerance, Monitored during session, Repositioned     PT Goals (current goals can now be found in the care plan section) Acute Rehab PT Goals Patient Stated Goal: to get warm PT Goal Formulation: With patient Time For Goal Achievement: 11/28/21 Potential to Achieve Goals: Fair Progress towards PT goals: Progressing toward goals    Frequency    Min 3X/week      PT Plan Current plan remains appropriate;Discharge plan needs to be updated       AM-PAC PT "6 Clicks" Mobility   Outcome Measure  Help needed turning from your back to your side while in a flat bed without using bedrails?: A Little Help needed moving from lying on your back to sitting on the side of a flat bed without using bedrails?: A Little Help needed moving to and from a bed to a chair (including a wheelchair)?: A Lot Help needed standing up from a chair using your arms (e.g., wheelchair or bedside chair)?: A Little Help needed to walk in hospital room?: A Lot Help needed climbing 3-5 steps with a railing? : A Lot 6 Click Score: 15    End of Session Equipment Utilized During Treatment: Gait belt Activity Tolerance: Patient tolerated treatment well;Treatment limited secondary to agitation Patient left: in bed;with call bell/phone within reach Nurse Communication: Mobility status PT Visit Diagnosis: Unsteadiness on  feet (R26.81);Other abnormalities of gait and mobility (R26.89);Muscle weakness (generalized) (M62.81);Difficulty in walking, not elsewhere classified (R26.2)     Time: 8850-2774 PT Time Calculation (min) (ACUTE ONLY): 23 min  Charges:  $Gait Training: 8-22 mins $Therapeutic Activity: 8-22 mins                     Ottis Vacha B. Migdalia Dk PT, DPT Acute Rehabilitation Services Please use secure chat or  Call Office [phone]    [place] 11/18/2021, 1:06 PM

## 2021-11-18 NOTE — Plan of Care (Signed)

## 2021-11-18 NOTE — TOC Transition Note (Signed)
Transition of Care [facility]) - CM/SW Discharge Note   Patient Details  Name: Denise Olson MRN: [id number] Date of Birth: 09/01/1945  Transition of [facility]) CM/SW Contact:  Benard Halsted, LCSW Phone Number: 11/18/2021, 1:42 PM   Clinical Narrative:    Patient will DC to: [facility] Anticipated DC date: 11/18/21 Family notified: Spouse at bedside Transport by: Corey Harold   Per MD patient ready for DC to [facility]. RN to call report prior to discharge [phone]). RN, patient, patient's family, and facility notified of DC. Discharge Summary and FL2 sent to facility. DC packet on chart. Ambulance transport requested for patient.   CSW will sign off for now as social work intervention is no longer needed. Please consult [place] again if new needs arise.     Final next level of care: Skilled Nursing Facility Barriers to Discharge: Barriers Resolved   Patient Goals and CMS Choice Patient states their goals for this hospitalization and ongoing recovery are:: Rehab CMS Medicare.gov Compare Post Acute Care list provided to:: Patient Represenative (must comment) Choice offered to / list presented to : Spouse  Discharge Placement   Existing PASRR number confirmed : 11/18/21          Patient chooses bed at:  [facility]) Patient to be transferred to facility by: [place] Name of family member notified: Spouse Patient and family notified of of transfer: 11/18/21  Discharge Plan and Services                                     Social Determinants of Health (SDOH) Interventions     Readmission Risk Interventions     No data to display

## 2021-11-22 ENCOUNTER — Ambulatory Visit: Payer: Medicare Other | Admitting: Physician Assistant

## 2021-11-22 DIAGNOSIS — I10 Essential (primary) hypertension: Secondary | ICD-10-CM | POA: Diagnosis not present

## 2021-11-22 DIAGNOSIS — M810 Age-related osteoporosis without current pathological fracture: Secondary | ICD-10-CM | POA: Diagnosis not present

## 2021-11-22 DIAGNOSIS — E785 Hyperlipidemia, unspecified: Secondary | ICD-10-CM | POA: Diagnosis not present

## 2021-11-22 DIAGNOSIS — J449 Chronic obstructive pulmonary disease, unspecified: Secondary | ICD-10-CM | POA: Diagnosis not present

## 2021-11-22 DIAGNOSIS — G301 Alzheimer's disease with late onset: Secondary | ICD-10-CM | POA: Diagnosis not present

## 2021-11-29 DIAGNOSIS — I10 Essential (primary) hypertension: Secondary | ICD-10-CM | POA: Diagnosis not present

## 2021-11-29 DIAGNOSIS — G309 Alzheimer's disease, unspecified: Secondary | ICD-10-CM | POA: Diagnosis not present

## 2021-12-01 DIAGNOSIS — E785 Hyperlipidemia, unspecified: Secondary | ICD-10-CM | POA: Diagnosis not present

## 2021-12-01 DIAGNOSIS — J449 Chronic obstructive pulmonary disease, unspecified: Secondary | ICD-10-CM | POA: Diagnosis not present

## 2021-12-01 DIAGNOSIS — I10 Essential (primary) hypertension: Secondary | ICD-10-CM | POA: Diagnosis not present

## 2021-12-01 DIAGNOSIS — G301 Alzheimer's disease with late onset: Secondary | ICD-10-CM | POA: Diagnosis not present

## 2021-12-01 DIAGNOSIS — M810 Age-related osteoporosis without current pathological fracture: Secondary | ICD-10-CM | POA: Diagnosis not present

## 2021-12-02 DIAGNOSIS — M810 Age-related osteoporosis without current pathological fracture: Secondary | ICD-10-CM | POA: Diagnosis not present

## 2021-12-02 DIAGNOSIS — J449 Chronic obstructive pulmonary disease, unspecified: Secondary | ICD-10-CM | POA: Diagnosis not present

## 2021-12-02 DIAGNOSIS — G301 Alzheimer's disease with late onset: Secondary | ICD-10-CM | POA: Diagnosis not present

## 2021-12-02 DIAGNOSIS — I10 Essential (primary) hypertension: Secondary | ICD-10-CM | POA: Diagnosis not present

## 2021-12-02 DIAGNOSIS — E785 Hyperlipidemia, unspecified: Secondary | ICD-10-CM | POA: Diagnosis not present

## 2021-12-06 DIAGNOSIS — J449 Chronic obstructive pulmonary disease, unspecified: Secondary | ICD-10-CM | POA: Diagnosis not present

## 2021-12-06 DIAGNOSIS — I10 Essential (primary) hypertension: Secondary | ICD-10-CM | POA: Diagnosis not present

## 2021-12-06 DIAGNOSIS — M810 Age-related osteoporosis without current pathological fracture: Secondary | ICD-10-CM | POA: Diagnosis not present

## 2021-12-06 DIAGNOSIS — E785 Hyperlipidemia, unspecified: Secondary | ICD-10-CM | POA: Diagnosis not present

## 2021-12-13 DIAGNOSIS — G301 Alzheimer's disease with late onset: Secondary | ICD-10-CM | POA: Diagnosis not present

## 2021-12-13 DIAGNOSIS — J449 Chronic obstructive pulmonary disease, unspecified: Secondary | ICD-10-CM | POA: Diagnosis not present

## 2021-12-13 DIAGNOSIS — R627 Adult failure to thrive: Secondary | ICD-10-CM | POA: Diagnosis not present

## 2021-12-13 DIAGNOSIS — M810 Age-related osteoporosis without current pathological fracture: Secondary | ICD-10-CM | POA: Diagnosis not present

## 2021-12-13 DIAGNOSIS — E785 Hyperlipidemia, unspecified: Secondary | ICD-10-CM | POA: Diagnosis not present

## 2021-12-13 DIAGNOSIS — I10 Essential (primary) hypertension: Secondary | ICD-10-CM | POA: Diagnosis not present

## 2021-12-20 DIAGNOSIS — G301 Alzheimer's disease with late onset: Secondary | ICD-10-CM | POA: Diagnosis not present

## 2021-12-20 DIAGNOSIS — E785 Hyperlipidemia, unspecified: Secondary | ICD-10-CM | POA: Diagnosis not present

## 2021-12-20 DIAGNOSIS — J449 Chronic obstructive pulmonary disease, unspecified: Secondary | ICD-10-CM | POA: Diagnosis not present

## 2021-12-20 DIAGNOSIS — R627 Adult failure to thrive: Secondary | ICD-10-CM | POA: Diagnosis not present

## 2021-12-20 DIAGNOSIS — M810 Age-related osteoporosis without current pathological fracture: Secondary | ICD-10-CM | POA: Diagnosis not present

## 2021-12-30 DIAGNOSIS — G301 Alzheimer's disease with late onset: Secondary | ICD-10-CM | POA: Diagnosis not present

## 2021-12-30 DIAGNOSIS — I10 Essential (primary) hypertension: Secondary | ICD-10-CM | POA: Diagnosis not present

## 2021-12-30 DIAGNOSIS — M810 Age-related osteoporosis without current pathological fracture: Secondary | ICD-10-CM | POA: Diagnosis not present

## 2021-12-30 DIAGNOSIS — R627 Adult failure to thrive: Secondary | ICD-10-CM | POA: Diagnosis not present

## 2021-12-30 DIAGNOSIS — E785 Hyperlipidemia, unspecified: Secondary | ICD-10-CM | POA: Diagnosis not present

## 2021-12-30 DIAGNOSIS — J449 Chronic obstructive pulmonary disease, unspecified: Secondary | ICD-10-CM | POA: Diagnosis not present

## 2022-01-05 ENCOUNTER — Non-Acute Institutional Stay: Payer: Medicare Other | Admitting: Internal Medicine

## 2022-01-05 ENCOUNTER — Encounter: Payer: Self-pay | Admitting: Internal Medicine

## 2022-01-05 VITALS — BP 110/76 | HR 97 | Temp 98.1°F | Resp 18 | Ht 63.0 in | Wt 93.8 lb

## 2022-01-05 DIAGNOSIS — U071 COVID-19: Secondary | ICD-10-CM | POA: Diagnosis not present

## 2022-01-05 DIAGNOSIS — Z515 Encounter for palliative care: Secondary | ICD-10-CM | POA: Diagnosis not present

## 2022-01-05 DIAGNOSIS — D531 Other megaloblastic anemias, not elsewhere classified: Secondary | ICD-10-CM

## 2022-01-05 DIAGNOSIS — F0280 Dementia in other diseases classified elsewhere without behavioral disturbance: Secondary | ICD-10-CM | POA: Diagnosis not present

## 2022-01-05 DIAGNOSIS — G309 Alzheimer's disease, unspecified: Secondary | ICD-10-CM | POA: Diagnosis not present

## 2022-01-05 DIAGNOSIS — R634 Abnormal weight loss: Secondary | ICD-10-CM

## 2022-01-05 NOTE — Progress Notes (Signed)
[facility] Palliative Care Consult Note Telephone: [phone]  Fax: [phone]   Date of encounter: 01/05/22 8:18 AM PATIENT NAME: Denise Olson [address] [place] [address]   [id number] (home)  DOB: 04-05-1946 MRN: [id number] PRIMARY CARE PROVIDER:    [name], MD,  [facility] [address] [place] [address] [phone]  REFERRING PROVIDER:   [name], MD [facility] [address] [place],  [place] [address] [phone]  RESPONSIBLE PARTY:    Contact Information     Name Relation Home Work Mobile   R[place],[name] [phone]          I met face to face with patient and family in [facility]. Palliative Care was asked to follow this patient by consultation request of  facility MD to address advance care planning and complex medical decision making. This is the initial visit.                                     ASSESSMENT AND PLAN / RECOMMENDATIONS:   Advance Care Planning/Goals of Care: Goals include to maximize quality of life and symptom management. Patient/health care surrogate gave his/her permission to discuss.Our advance care planning conversation included a discussion about:    The value and importance of advance care planning  Experiences with loved ones who have been seriously ill or have died  Exploration of personal, cultural or spiritual beliefs that might influence medical decisions  Exploration of goals of care in the event of a sudden injury or illness  Identification  of a healthcare agent--husband Peter Bangert per chart Review and updating or creation of an  advance directive document . Decision not to resuscitate or to de-escalate disease focused treatments due to poor prognosis. CODE STATUS:  DNR here at [facility]  Symptom Management/Plan: 1. Alzheimer's disease ([place]) -end stage, full adl dependence, now eating bites, taking sips only since getting covid, word  salads with little speech she has, incontinent, not ambulatory since covid, severe weight loss  2. 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 COVID-19 -has led to further decline in her AD  -on paxlovid -hallucinating, coughing and eating only bites, taking only sips  4. Abnormal weight loss -down from 121.25lb 7/24 to 93.8 lb 9/13 with bmi now 16.62 at 76f 3 in -qualifies for hospice   5. Palliative care encounter -need to speak with husband about hospice option due to decline--will recommend hospice assessment  Follow up Palliative Care Visit: Palliative care will continue to follow for complex medical decision making, advance care planning, and clarification of goals.    This visit was coded based on medical decision making (MDM).  PPS: 20%  HOSPICE ELIGIBILITY/DIAGNOSIS: yes/abnormal weight loss/AD with covid  Chief Complaint: [facility] palliative [facility]  JBinnie Olson is a 76y.o. year old female  with Alzheimer's disease, HTN, prior breast cancer on evista, COPD, senile osteoporosis, aortic atherosclerosis, prediabetes, hyperlipidemia, tobacco abuse and protein calorie malnutrition [facility] She had just been admitted to the hospital with delirium, progressive decline over the past 3-4 wks in terms of increased incontinence, inability to ambulate onher own, balance issues, fatigue, and falling asleep in the chair in the day.  She had been diagnosed with AD about a year ago, but for  the last few months was developing hallucinations and increased confusion.  She spent one day lying on the floor and would not allow her husband to help her up.  Hospital workup for acute causes was revealing only for borderline low b12 and folate for which repletion was initiated.    Staff notified me after I saw her that pt had been diagnosed with covid prior week and was on paxlovid.   She is getting magic cup.  She is not eating but a few bites, takes her meds with sips.  She has not been out of bed since last week.  She's become mostly nonverbal.  She's hallucinating.  She's been coughing.  History obtained from review of EMR, discussion with primary team, and interview with family, facility staff/caregiver and/or Ms. Milford.   I reviewed available labs, medications, imaging, studies and related documents from the EMR.  Records reviewed and summarized above.   ROS Review of Systems  Unable to perform ROS: Dementia    Physical Exam: Vitals:   01/05/22 0931  BP: 110/76  Pulse: 97  Resp: 18  Temp: 98.1 F (36.7 C)  SpO2: 97%  Weight: 93 lb 12.8 oz (42.5 kg)  Height: _0  (1.6 m)   Body mass index is 16.62 kg/m. Wt Readings from Last 500 Encounters:  01/05/22 93 lb 12.8 oz (42.5 kg)  11/15/21 121 lb 4.1 oz (55 kg)  05/20/21 111 lb (50.3 kg)  11/12/20 115 lb 6.4 oz (52.3 kg)  03/02/15 115 lb (52.2 kg)   Physical Exam Constitutional:      Appearance: She is ill-appearing.     Comments: Very cachectic, frail, pale female resting in bed  HENT:     Head: Normocephalic and atraumatic.     Mouth/Throat:     Mouth: Mucous membranes are dry.  Eyes:     Conjunctiva/sclera: Conjunctivae normal.     Pupils: Pupils are equal, round, and reactive to light.  Cardiovascular:     Rate and Rhythm: Regular rhythm. Tachycardia present.  Pulmonary:     Breath sounds: Rhonchi present.     Comments: Throughout lung fields Abdominal:     General: Bowel sounds are normal.     Tenderness: There is no abdominal tenderness.  Musculoskeletal:        General: Normal range of motion.     Right lower leg: No edema.     Left lower leg: No edema.  Skin:    General: Skin is warm and dry.     Coloration: Skin is pale.  Neurological:     General: No focal deficit present.     Comments: Spoke a few unclear words in salad  Psychiatric:     Comments: Actively hallucinating      CURRENT PROBLEM LIST:  Patient Active Problem List   Diagnosis Date Noted   Essential hypertension 11/13/2021   Mixed hyperlipidemia 11/13/2021   Nicotine dependence 11/13/2021   Acute encephalopathy 11/13/2021   History of breast cancer 11/13/2021   Elevated troponin 11/13/2021   Alzheimer's disease ([place]) 05/20/2021   Pain due to onychomycosis of toenails of both feet 08/17/2020   PAST MEDICAL HISTORY:  Active Ambulatory Problems    Diagnosis Date Noted   Pain due to onychomycosis of toenails of both feet 08/17/2020   Alzheimer's disease ([place]) 05/20/2021   Essential hypertension 11/13/2021   Mixed hyperlipidemia 11/13/2021   Nicotine dependence 11/13/2021   Acute encephalopathy 11/13/2021   History of breast cancer 11/13/2021   Elevated troponin  11/13/2021   Resolved Ambulatory Problems    Diagnosis Date Noted   Chronic obstructive pulmonary disease, unspecified ([place]) 11/13/2021   Past Medical History:  Diagnosis Date   Breast cancer ([place])    Cancer ([place])    Personal history of chemotherapy    SOCIAL HX:  Social History   Tobacco Use   Smoking status: Some Days    Packs/day: 0.50    Types: Cigarettes   Smokeless tobacco: Never  Substance Use Topics   Alcohol use: No   FAMILY HX:  Family History  Problem Relation Age of Onset   Breast cancer Neg Hx       ALLERGIES:  Allergies  Allergen Reactions   Codeine Other (See Comments)   Hydrocodone-Ibuprofen Other (See Comments)   Penicillins Other (See Comments)    Unsure    Latex Rash      PERTINENT MEDICATIONS:  Outpatient Encounter Medications as of 01/05/2022  Medication Sig   acetaminophen (TYLENOL) 325 MG tablet Take 2 tablets (650 mg total) by mouth every 6 (six) hours as needed for mild pain (or Fever >/= 101).   aspirin 81 MG chewable tablet Chew 81 mg by mouth daily.   atorvastatin (LIPITOR) 40 MG tablet Take 1 tablet by mouth daily.   Calcium Carbonate-Vit D-Min (CALCIUM 1200 PO) Take 1,200  mg by mouth daily.   cyanocobalamin 1000 MCG tablet Take 1 tablet (1,000 mcg total) by mouth daily.   folic acid (FOLVITE) 1 MG tablet Take 1 tablet (1 mg total) by mouth daily.   memantine (NAMENDA) 5 MG tablet Take 1 tablet (5 mg total) by mouth daily.   mirtazapine (REMERON) 7.5 MG tablet Take 7.5 mg by mouth at bedtime.   Multiple Vitamin (MULTIVITAMIN WITH MINERALS) TABS tablet Take 1 tablet by mouth daily.   raloxifene (EVISTA) 60 MG tablet Take 60 mg by mouth daily.   ramipril (ALTACE) 5 MG capsule Take 5 mg by mouth daily.   [DISCONTINUED] donepezil (ARICEPT) 10 MG tablet Take one tablet daily (Patient taking differently: Take 10 mg by mouth at bedtime.)   No facility-administered encounter medications on file as of 01/05/2022.    Thank you for the opportunity to participate in the care of Ms. Piatt.  The palliative care team will continue to follow. Please call our office at [phone] if we can be of additional assistance.   Hollace Kinnier, DO  COVID-19 PATIENT SCREENING TOOL Asked and negative response unless otherwise noted:  Have you had symptoms of covid, tested positive or been in contact with someone with symptoms/positive test in the past 5-10 days?  NO

## 2022-01-13 ENCOUNTER — Other Ambulatory Visit: Payer: Self-pay | Admitting: Neurology

## 2022-01-17 DIAGNOSIS — I10 Essential (primary) hypertension: Secondary | ICD-10-CM | POA: Diagnosis not present

## 2022-01-17 DIAGNOSIS — M810 Age-related osteoporosis without current pathological fracture: Secondary | ICD-10-CM | POA: Diagnosis not present

## 2022-01-17 DIAGNOSIS — E785 Hyperlipidemia, unspecified: Secondary | ICD-10-CM | POA: Diagnosis not present

## 2022-01-17 DIAGNOSIS — R5383 Other fatigue: Secondary | ICD-10-CM | POA: Diagnosis not present

## 2022-01-17 DIAGNOSIS — J449 Chronic obstructive pulmonary disease, unspecified: Secondary | ICD-10-CM | POA: Diagnosis not present

## 2022-01-17 DIAGNOSIS — G301 Alzheimer's disease with late onset: Secondary | ICD-10-CM | POA: Diagnosis not present

## 2022-01-17 DIAGNOSIS — R627 Adult failure to thrive: Secondary | ICD-10-CM | POA: Diagnosis not present

## 2022-01-19 ENCOUNTER — Encounter: Payer: Self-pay | Admitting: Internal Medicine

## 2022-01-24 DIAGNOSIS — G309 Alzheimer's disease, unspecified: Secondary | ICD-10-CM | POA: Diagnosis not present

## 2022-01-24 DIAGNOSIS — E43 Unspecified severe protein-calorie malnutrition: Secondary | ICD-10-CM | POA: Diagnosis not present

## 2022-01-24 DIAGNOSIS — R159 Full incontinence of feces: Secondary | ICD-10-CM | POA: Diagnosis not present

## 2022-01-24 DIAGNOSIS — E785 Hyperlipidemia, unspecified: Secondary | ICD-10-CM | POA: Diagnosis not present

## 2022-01-24 DIAGNOSIS — R1310 Dysphagia, unspecified: Secondary | ICD-10-CM | POA: Diagnosis not present

## 2022-01-24 DIAGNOSIS — F0280 Dementia in other diseases classified elsewhere without behavioral disturbance: Secondary | ICD-10-CM | POA: Diagnosis not present

## 2022-01-24 DIAGNOSIS — R32 Unspecified urinary incontinence: Secondary | ICD-10-CM | POA: Diagnosis not present

## 2022-01-24 DIAGNOSIS — I10 Essential (primary) hypertension: Secondary | ICD-10-CM | POA: Diagnosis not present

## 2022-01-25 DIAGNOSIS — E43 Unspecified severe protein-calorie malnutrition: Secondary | ICD-10-CM | POA: Diagnosis not present

## 2022-01-25 DIAGNOSIS — F0280 Dementia in other diseases classified elsewhere without behavioral disturbance: Secondary | ICD-10-CM | POA: Diagnosis not present

## 2022-01-25 DIAGNOSIS — G309 Alzheimer's disease, unspecified: Secondary | ICD-10-CM | POA: Diagnosis not present

## 2022-01-25 DIAGNOSIS — R159 Full incontinence of feces: Secondary | ICD-10-CM | POA: Diagnosis not present

## 2022-01-25 DIAGNOSIS — R32 Unspecified urinary incontinence: Secondary | ICD-10-CM | POA: Diagnosis not present

## 2022-01-25 DIAGNOSIS — R1310 Dysphagia, unspecified: Secondary | ICD-10-CM | POA: Diagnosis not present

## 2022-01-26 DIAGNOSIS — R1310 Dysphagia, unspecified: Secondary | ICD-10-CM | POA: Diagnosis not present

## 2022-01-26 DIAGNOSIS — F0280 Dementia in other diseases classified elsewhere without behavioral disturbance: Secondary | ICD-10-CM | POA: Diagnosis not present

## 2022-01-26 DIAGNOSIS — E43 Unspecified severe protein-calorie malnutrition: Secondary | ICD-10-CM | POA: Diagnosis not present

## 2022-01-26 DIAGNOSIS — R32 Unspecified urinary incontinence: Secondary | ICD-10-CM | POA: Diagnosis not present

## 2022-01-26 DIAGNOSIS — R159 Full incontinence of feces: Secondary | ICD-10-CM | POA: Diagnosis not present

## 2022-01-26 DIAGNOSIS — G309 Alzheimer's disease, unspecified: Secondary | ICD-10-CM | POA: Diagnosis not present

## 2022-01-27 DIAGNOSIS — G309 Alzheimer's disease, unspecified: Secondary | ICD-10-CM | POA: Diagnosis not present

## 2022-01-27 DIAGNOSIS — R1310 Dysphagia, unspecified: Secondary | ICD-10-CM | POA: Diagnosis not present

## 2022-01-27 DIAGNOSIS — R32 Unspecified urinary incontinence: Secondary | ICD-10-CM | POA: Diagnosis not present

## 2022-01-27 DIAGNOSIS — R159 Full incontinence of feces: Secondary | ICD-10-CM | POA: Diagnosis not present

## 2022-01-27 DIAGNOSIS — F0280 Dementia in other diseases classified elsewhere without behavioral disturbance: Secondary | ICD-10-CM | POA: Diagnosis not present

## 2022-01-27 DIAGNOSIS — E43 Unspecified severe protein-calorie malnutrition: Secondary | ICD-10-CM | POA: Diagnosis not present

## 2022-01-28 DIAGNOSIS — R32 Unspecified urinary incontinence: Secondary | ICD-10-CM | POA: Diagnosis not present

## 2022-01-28 DIAGNOSIS — G309 Alzheimer's disease, unspecified: Secondary | ICD-10-CM | POA: Diagnosis not present

## 2022-01-28 DIAGNOSIS — E43 Unspecified severe protein-calorie malnutrition: Secondary | ICD-10-CM | POA: Diagnosis not present

## 2022-01-28 DIAGNOSIS — R1310 Dysphagia, unspecified: Secondary | ICD-10-CM | POA: Diagnosis not present

## 2022-01-28 DIAGNOSIS — R159 Full incontinence of feces: Secondary | ICD-10-CM | POA: Diagnosis not present

## 2022-01-28 DIAGNOSIS — F0280 Dementia in other diseases classified elsewhere without behavioral disturbance: Secondary | ICD-10-CM | POA: Diagnosis not present

## 2022-01-29 DIAGNOSIS — G309 Alzheimer's disease, unspecified: Secondary | ICD-10-CM | POA: Diagnosis not present

## 2022-01-29 DIAGNOSIS — F0280 Dementia in other diseases classified elsewhere without behavioral disturbance: Secondary | ICD-10-CM | POA: Diagnosis not present

## 2022-01-29 DIAGNOSIS — R32 Unspecified urinary incontinence: Secondary | ICD-10-CM | POA: Diagnosis not present

## 2022-01-29 DIAGNOSIS — R159 Full incontinence of feces: Secondary | ICD-10-CM | POA: Diagnosis not present

## 2022-01-29 DIAGNOSIS — R1310 Dysphagia, unspecified: Secondary | ICD-10-CM | POA: Diagnosis not present

## 2022-01-29 DIAGNOSIS — E43 Unspecified severe protein-calorie malnutrition: Secondary | ICD-10-CM | POA: Diagnosis not present

## 2022-01-30 DIAGNOSIS — E43 Unspecified severe protein-calorie malnutrition: Secondary | ICD-10-CM | POA: Diagnosis not present

## 2022-01-30 DIAGNOSIS — R1310 Dysphagia, unspecified: Secondary | ICD-10-CM | POA: Diagnosis not present

## 2022-01-30 DIAGNOSIS — F0280 Dementia in other diseases classified elsewhere without behavioral disturbance: Secondary | ICD-10-CM | POA: Diagnosis not present

## 2022-01-30 DIAGNOSIS — G309 Alzheimer's disease, unspecified: Secondary | ICD-10-CM | POA: Diagnosis not present

## 2022-01-30 DIAGNOSIS — R159 Full incontinence of feces: Secondary | ICD-10-CM | POA: Diagnosis not present

## 2022-01-30 DIAGNOSIS — R32 Unspecified urinary incontinence: Secondary | ICD-10-CM | POA: Diagnosis not present

## 2022-01-31 DIAGNOSIS — R32 Unspecified urinary incontinence: Secondary | ICD-10-CM | POA: Diagnosis not present

## 2022-01-31 DIAGNOSIS — G309 Alzheimer's disease, unspecified: Secondary | ICD-10-CM | POA: Diagnosis not present

## 2022-01-31 DIAGNOSIS — F0280 Dementia in other diseases classified elsewhere without behavioral disturbance: Secondary | ICD-10-CM | POA: Diagnosis not present

## 2022-01-31 DIAGNOSIS — E43 Unspecified severe protein-calorie malnutrition: Secondary | ICD-10-CM | POA: Diagnosis not present

## 2022-01-31 DIAGNOSIS — R159 Full incontinence of feces: Secondary | ICD-10-CM | POA: Diagnosis not present

## 2022-01-31 DIAGNOSIS — R1310 Dysphagia, unspecified: Secondary | ICD-10-CM | POA: Diagnosis not present

## 2022-02-01 DIAGNOSIS — E43 Unspecified severe protein-calorie malnutrition: Secondary | ICD-10-CM | POA: Diagnosis not present

## 2022-02-01 DIAGNOSIS — R1310 Dysphagia, unspecified: Secondary | ICD-10-CM | POA: Diagnosis not present

## 2022-02-01 DIAGNOSIS — R32 Unspecified urinary incontinence: Secondary | ICD-10-CM | POA: Diagnosis not present

## 2022-02-01 DIAGNOSIS — F0280 Dementia in other diseases classified elsewhere without behavioral disturbance: Secondary | ICD-10-CM | POA: Diagnosis not present

## 2022-02-01 DIAGNOSIS — R159 Full incontinence of feces: Secondary | ICD-10-CM | POA: Diagnosis not present

## 2022-02-01 DIAGNOSIS — G309 Alzheimer's disease, unspecified: Secondary | ICD-10-CM | POA: Diagnosis not present

## 2022-02-02 DIAGNOSIS — G309 Alzheimer's disease, unspecified: Secondary | ICD-10-CM | POA: Diagnosis not present

## 2022-02-02 DIAGNOSIS — R159 Full incontinence of feces: Secondary | ICD-10-CM | POA: Diagnosis not present

## 2022-02-02 DIAGNOSIS — R1310 Dysphagia, unspecified: Secondary | ICD-10-CM | POA: Diagnosis not present

## 2022-02-02 DIAGNOSIS — E43 Unspecified severe protein-calorie malnutrition: Secondary | ICD-10-CM | POA: Diagnosis not present

## 2022-02-02 DIAGNOSIS — F0280 Dementia in other diseases classified elsewhere without behavioral disturbance: Secondary | ICD-10-CM | POA: Diagnosis not present

## 2022-02-02 DIAGNOSIS — R32 Unspecified urinary incontinence: Secondary | ICD-10-CM | POA: Diagnosis not present

## 2022-02-11 ENCOUNTER — Ambulatory Visit: Payer: Medicare Other | Admitting: Podiatry

## 2022-02-23 DEATH — deceased

## 2023-08-28 IMAGING — MG MM DIGITAL SCREENING UNILAT*R* W/ TOMO W/ CAD
4 series · 4 of 12 positions shown · non-contrast
Comparison: Previous exam(s).

CLINICAL DATA: Screening.

EXAM:
DIGITAL SCREENING UNILATERAL RIGHT MAMMOGRAM WITH CAD AND
TOMOSYNTHESIS
TECHNIQUE: Right screening digital craniocaudal and mediolateral oblique
mammograms were obtained. Right screening digital breast
tomosynthesis was performed. The images were evaluated with
computer-aided detection.

[R CC synth-2D]
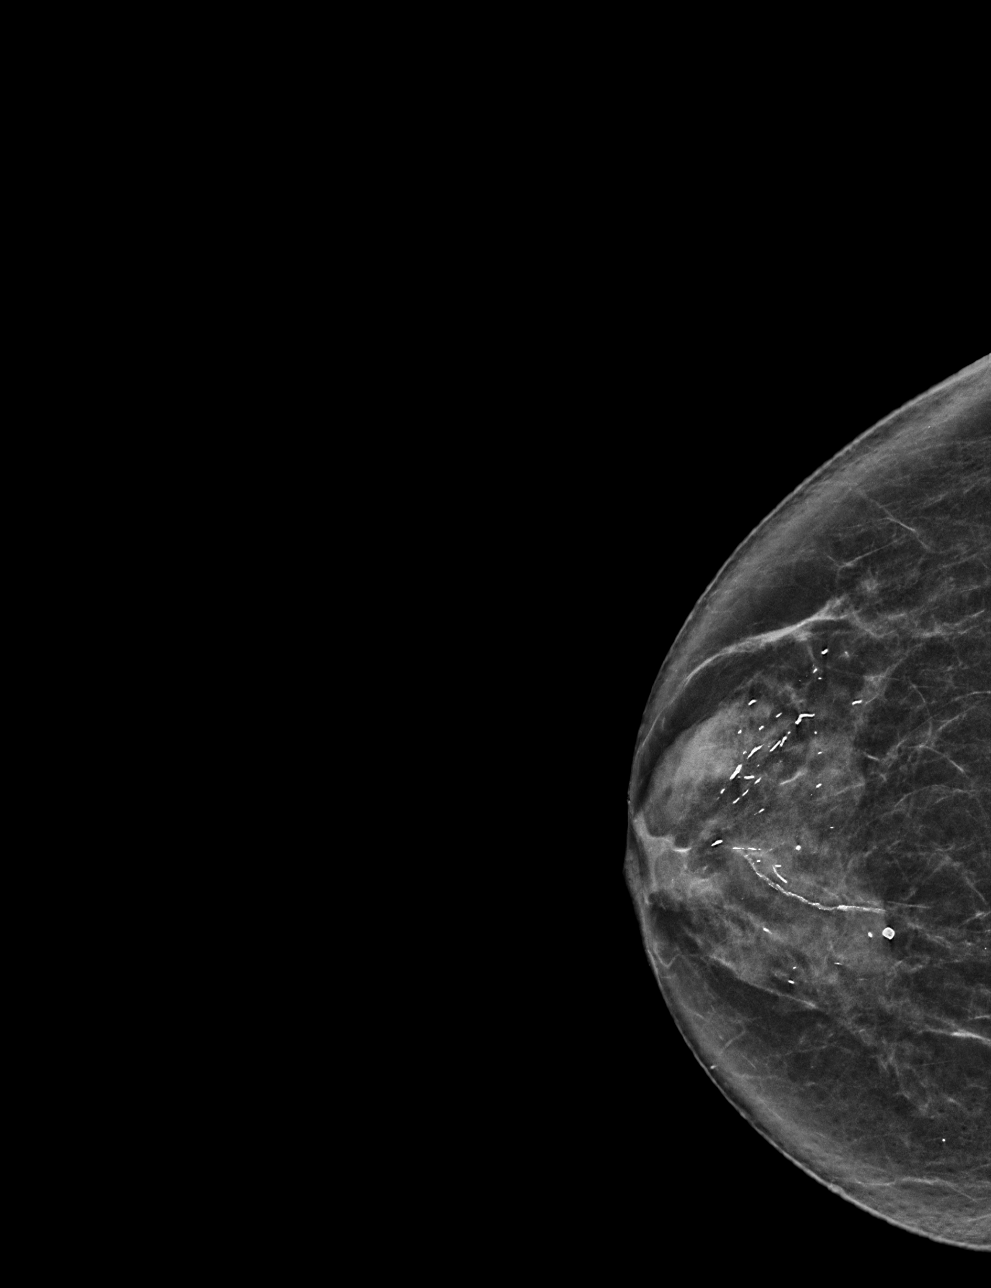

[R MLO synth-2D]
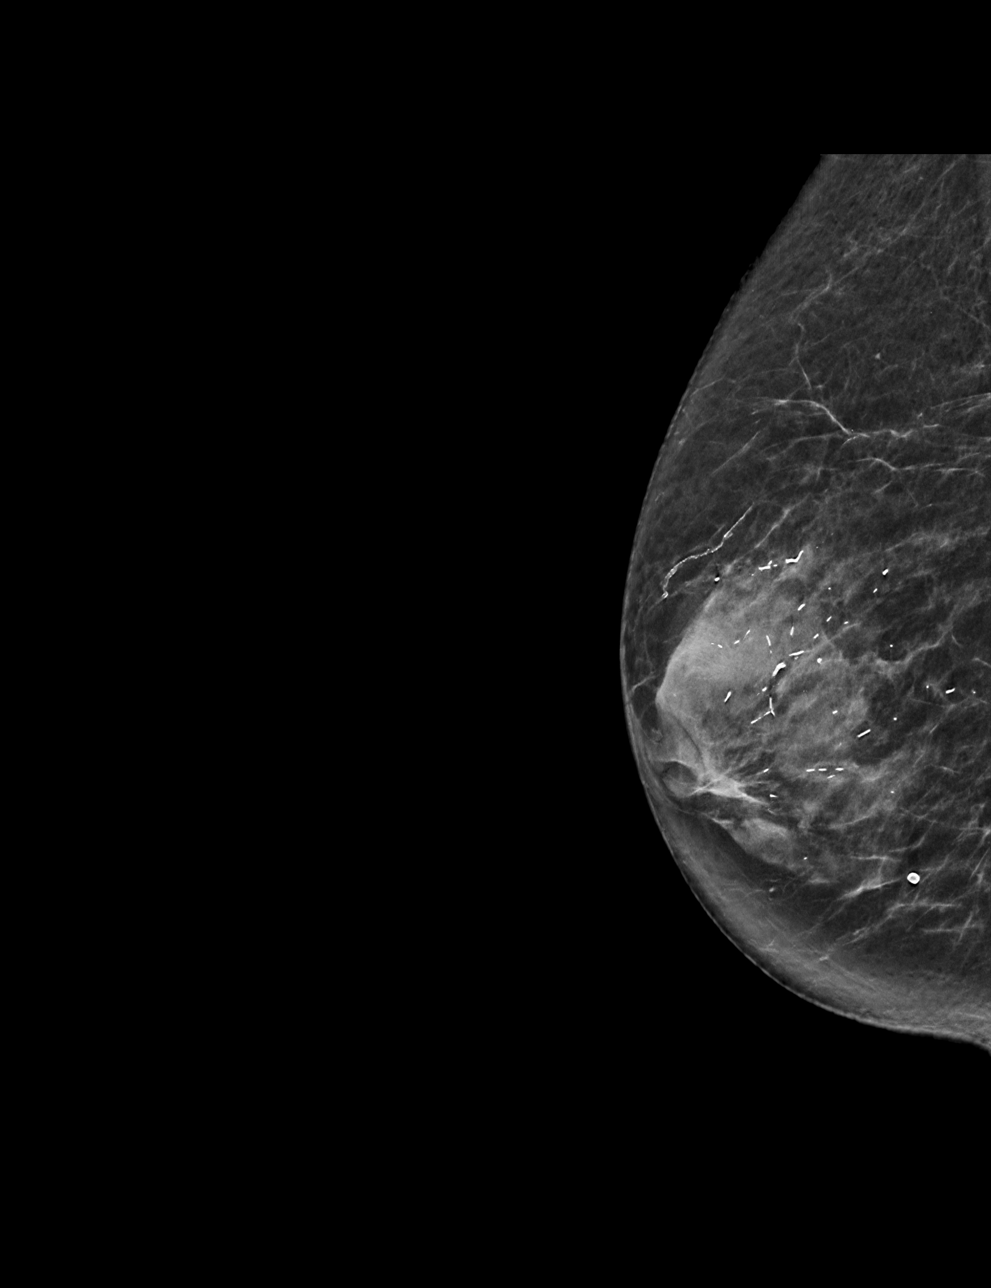

[R CC tomo · tomo slice 29/58.0]
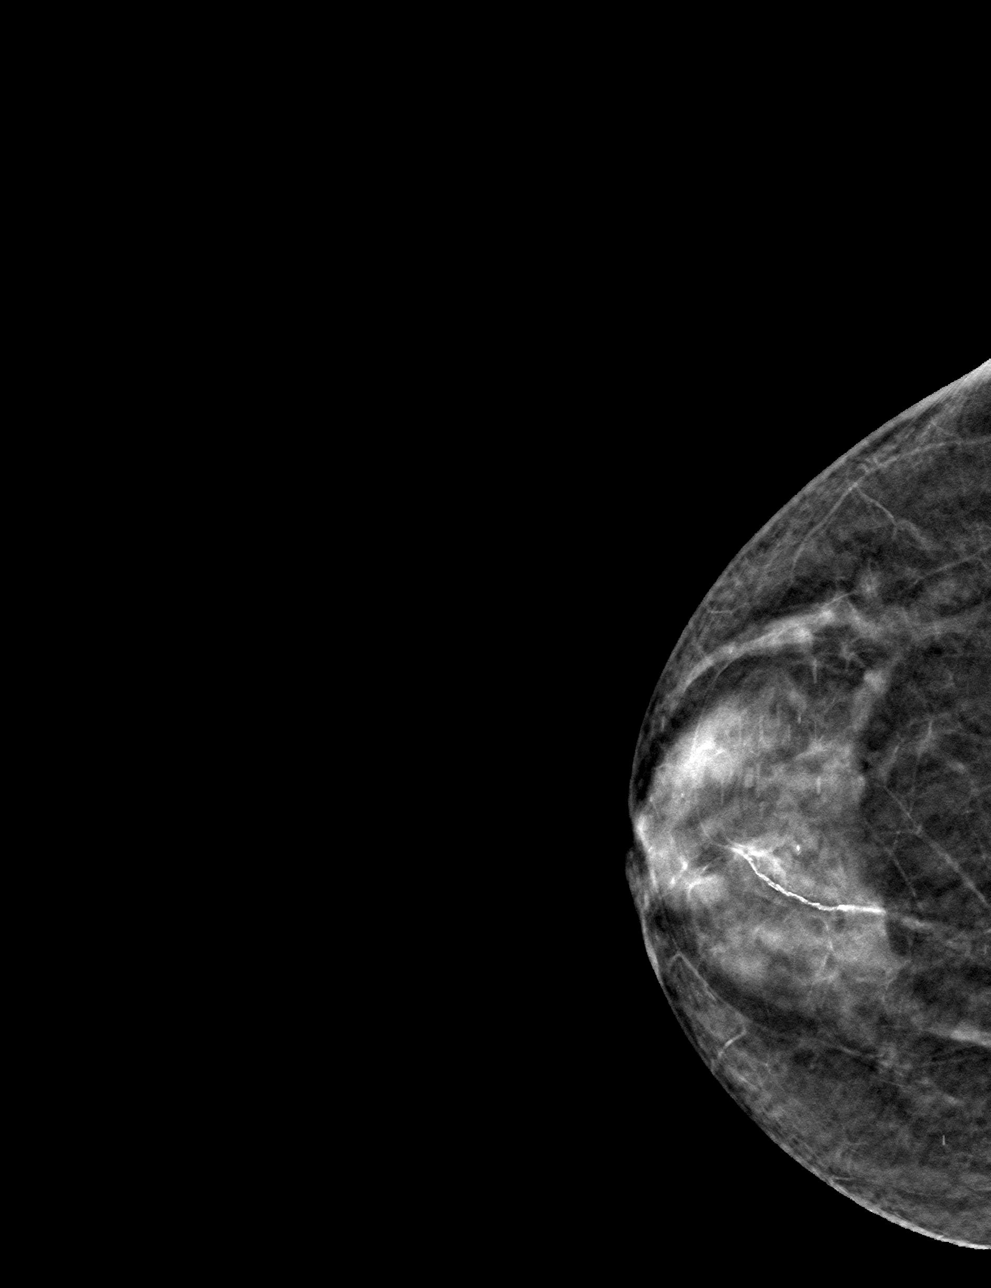

[R MLO tomo · tomo slice 30/59.0]
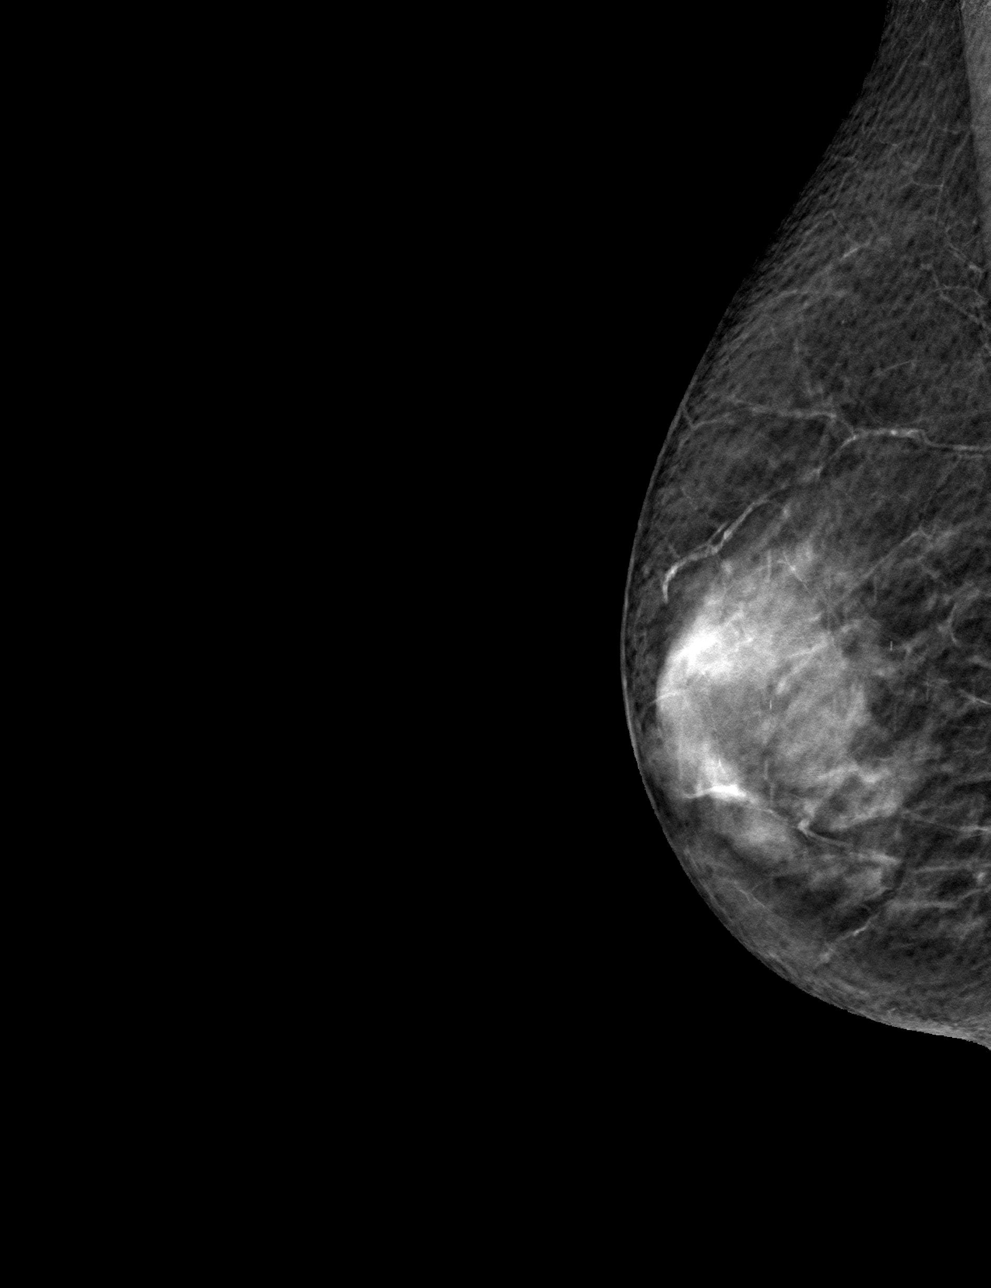

[4 of 12 positions shown; findings below may reference images not displayed]

ACR Breast Density Category c: The breast tissue is heterogeneously
dense, which may obscure small masses.
FINDINGS: The patient has had a left mastectomy. There are no findings
suspicious for malignancy.
IMPRESSION: No mammographic evidence of malignancy. A result letter of this
screening mammogram will be mailed directly to the patient.

RECOMMENDATION:
Screening mammogram in one year.  (Code:V6-Y-3H6)

BI-RADS CATEGORY  1: Negative.
